# Patient Record
Sex: Female | Born: 1950 | Race: Black or African American | Hispanic: No | Smoking: Never smoker
Health system: Southern US, Community
[De-identification: ages and names within clinical notes are randomized; demographics above are authoritative.]

## PROBLEM LIST (undated history)

## (undated) DIAGNOSIS — F32A Depression, unspecified: Secondary | ICD-10-CM

## (undated) DIAGNOSIS — M1712 Unilateral primary osteoarthritis, left knee: Secondary | ICD-10-CM

## (undated) DIAGNOSIS — I10 Essential (primary) hypertension: Secondary | ICD-10-CM

## (undated) DIAGNOSIS — M48061 Spinal stenosis, lumbar region without neurogenic claudication: Secondary | ICD-10-CM

## (undated) HISTORY — DX: Unilateral primary osteoarthritis, left knee: M17.12

## (undated) HISTORY — PX: BREAST REDUCTION SURGERY: SHX8

## (undated) HISTORY — PX: OTHER SURGICAL HISTORY: SHX169

## (undated) HISTORY — PX: SPINE SURGERY: SHX786

## (undated) HISTORY — DX: Spinal stenosis, lumbar region without neurogenic claudication: M48.061

## (undated) HISTORY — PX: KNEE ARTHROSCOPY: SUR90

---

## 2008-03-27 DIAGNOSIS — G4730 Sleep apnea, unspecified: Secondary | ICD-10-CM | POA: Insufficient documentation

## 2010-09-25 DIAGNOSIS — N3941 Urge incontinence: Secondary | ICD-10-CM | POA: Insufficient documentation

## 2010-09-25 DIAGNOSIS — K219 Gastro-esophageal reflux disease without esophagitis: Secondary | ICD-10-CM | POA: Insufficient documentation

## 2010-09-25 DIAGNOSIS — I10 Essential (primary) hypertension: Secondary | ICD-10-CM | POA: Insufficient documentation

## 2010-09-25 DIAGNOSIS — E782 Mixed hyperlipidemia: Secondary | ICD-10-CM | POA: Insufficient documentation

## 2010-09-25 DIAGNOSIS — E559 Vitamin D deficiency, unspecified: Secondary | ICD-10-CM | POA: Insufficient documentation

## 2013-03-27 DIAGNOSIS — S42143A Displaced fracture of glenoid cavity of scapula, unspecified shoulder, initial encounter for closed fracture: Secondary | ICD-10-CM | POA: Insufficient documentation

## 2013-03-27 DIAGNOSIS — S42153A Displaced fracture of neck of scapula, unspecified shoulder, initial encounter for closed fracture: Secondary | ICD-10-CM | POA: Insufficient documentation

## 2013-03-27 DIAGNOSIS — M19019 Primary osteoarthritis, unspecified shoulder: Secondary | ICD-10-CM | POA: Insufficient documentation

## 2013-03-27 DIAGNOSIS — M7580 Other shoulder lesions, unspecified shoulder: Secondary | ICD-10-CM | POA: Insufficient documentation

## 2015-07-15 DIAGNOSIS — M5136 Other intervertebral disc degeneration, lumbar region: Secondary | ICD-10-CM | POA: Insufficient documentation

## 2015-07-15 DIAGNOSIS — M545 Low back pain, unspecified: Secondary | ICD-10-CM | POA: Insufficient documentation

## 2015-09-02 DIAGNOSIS — Z86010 Personal history of colonic polyps: Secondary | ICD-10-CM | POA: Insufficient documentation

## 2015-10-15 DIAGNOSIS — N393 Stress incontinence (female) (male): Secondary | ICD-10-CM | POA: Insufficient documentation

## 2015-12-23 ENCOUNTER — Encounter (INDEPENDENT_AMBULATORY_CARE_PROVIDER_SITE_OTHER): Payer: Medicare HMO | Admitting: Sports Medicine

## 2015-12-24 ENCOUNTER — Ambulatory Visit (INDEPENDENT_AMBULATORY_CARE_PROVIDER_SITE_OTHER): Payer: Medicare HMO

## 2015-12-24 ENCOUNTER — Ambulatory Visit (INDEPENDENT_AMBULATORY_CARE_PROVIDER_SITE_OTHER): Payer: Medicare HMO | Admitting: Sports Medicine

## 2015-12-24 DIAGNOSIS — M170 Bilateral primary osteoarthritis of knee: Secondary | ICD-10-CM | POA: Diagnosis not present

## 2015-12-24 DIAGNOSIS — M5136 Other intervertebral disc degeneration, lumbar region: Secondary | ICD-10-CM | POA: Diagnosis not present

## 2015-12-24 DIAGNOSIS — M545 Low back pain, unspecified: Secondary | ICD-10-CM

## 2015-12-24 DIAGNOSIS — M1712 Unilateral primary osteoarthritis, left knee: Secondary | ICD-10-CM

## 2015-12-24 DIAGNOSIS — M48061 Spinal stenosis, lumbar region without neurogenic claudication: Secondary | ICD-10-CM

## 2015-12-24 DIAGNOSIS — M419 Scoliosis, unspecified: Secondary | ICD-10-CM

## 2015-12-24 HISTORY — DX: Unilateral primary osteoarthritis, left knee: M17.12

## 2015-12-24 HISTORY — DX: Spinal stenosis, lumbar region without neurogenic claudication: M48.061

## 2015-12-24 MED ORDER — NAPROXEN 500 MG PO TABS: 500.0000 mg | ORAL_TABLET | Freq: Two times a day (BID) | ORAL | 3 refills | Status: DC

## 2015-12-24 NOTE — Assessment & Plan Note (Addendum)
Has had injections and viscous supplementation years ago. Repeat injection today, x-rays, naproxen. Physical therapy.  Return to see me in one month, we will get her approved again for Visco supplementation if no better.

## 2015-12-24 NOTE — Progress Notes (Signed)
   Subjective:    I'm seeing this patient as a consultation for:  Dr. Faythe GheeWarnimont  CC: Left knee pain  HPI:. Left knee pain: This is a pleasant 65 year old female with a history of osteoarthritis, history of viscous supplementation and steroid injections in the past, as well as knee arthroscopy. Now having recurrence of pain, swelling, localized at the medial joint line without any trauma, no constitutional symptoms, no mechanical symptoms.  Back pain: Axial, worse with laying in bed, standing up straight. Nothing overtly radicular pain does radiate down to the left anterolateral thigh. No bowel or bladder dysfunction, saddle numbness, constitutional symptoms.  Past medical history:  Negative.  See flowsheet/record as well for more information.  Surgical history: Negative.  See flowsheet/record as well for more information.  Family history: Negative.  See flowsheet/record as well for more information.  Social history: Negative.  See flowsheet/record as well for more information.  Allergies, and medications have been entered into the medical record, reviewed, and no changes needed.   Review of Systems: No headache, visual changes, nausea, vomiting, diarrhea, constipation, dizziness, abdominal pain, skin rash, fevers, chills, night sweats, weight loss, swollen lymph nodes, body aches, joint swelling, muscle aches, chest pain, shortness of breath, mood changes, visual or auditory hallucinations.   Objective:   General: Well Developed, well nourished, and in no acute distress.  Neuro/Psych: Alert and oriented x3, extra-ocular muscles intact, able to move all 4 extremities, sensation grossly intact. Skin: Warm and dry, no rashes noted.  Respiratory: Not using accessory muscles, speaking in full sentences, trachea midline.  Cardiovascular: Pulses palpable, no extremity edema. Abdomen: Does not appear distended. Left Knee: Normal to inspection with no erythema or effusion or obvious bony  abnormalities. Swollen with tenderness at the medial joint line ROM normal in flexion and extension and lower leg rotation. Ligaments with solid consistent endpoints including ACL, PCL, LCL, MCL. Negative Mcmurray's and provocative meniscal tests. Non painful patellar compression. Patellar and quadriceps tendons unremarkable. Hamstring and quadriceps strength is normal.  Procedure: Real-time Ultrasound Guided Injection of left knee Device: GE Logiq E  Verbal informed consent obtained.  Time-out conducted.  Noted no overlying erythema, induration, or other signs of local infection.  Skin prepped in a sterile fashion.  Local anesthesia: Topical Ethyl chloride.  With sterile technique and under real time ultrasound guidance:  1 mL kenalog 40, 2 mL lidocaine, 2 mL Marcaine injected easily. Completed without difficulty  Pain immediately resolved suggesting accurate placement of the medication.  Advised to call if fevers/chills, erythema, induration, drainage, or persistent bleeding.  Images permanently stored and available for review in the ultrasound unit.  Impression: Technically successful ultrasound guided injection.  Impression and Recommendations:   This case required medical decision making of moderate complexity.  Primary osteoarthritis of left knee Has had injections and viscous supplementation years ago. Repeat injection today, x-rays, naproxen. Physical therapy.  Return to see me in one month, we will get her approved again for Visco supplementation if no better.  Low back pain Axial and facetogenic back pain. She did have what sounds to be either an epidural or facet injection with [facility], and agrees to get me records of her injection.  Formal physical therapy, x-rays. MRI if no better after 4-6 weeks.

## 2015-12-24 NOTE — Assessment & Plan Note (Signed)
Axial and facetogenic back pain. She did have what sounds to be either an epidural or facet injection with [facility], and agrees to get me records of her injection.  Formal physical therapy, x-rays. MRI if no better after 4-6 weeks.

## 2016-01-06 ENCOUNTER — Ambulatory Visit: Payer: Medicare HMO | Admitting: Physical Therapy

## 2016-01-10 ENCOUNTER — Ambulatory Visit: Payer: Medicare HMO | Admitting: Rehabilitative and Restorative Service Providers"

## 2016-01-20 ENCOUNTER — Ambulatory Visit (INDEPENDENT_AMBULATORY_CARE_PROVIDER_SITE_OTHER): Payer: Medicare HMO | Admitting: Sports Medicine

## 2016-01-20 ENCOUNTER — Telehealth: Payer: Self-pay | Admitting: Sports Medicine

## 2016-01-20 DIAGNOSIS — M545 Low back pain, unspecified: Secondary | ICD-10-CM

## 2016-01-20 DIAGNOSIS — M1712 Unilateral primary osteoarthritis, left knee: Secondary | ICD-10-CM | POA: Diagnosis not present

## 2016-01-20 MED ORDER — GABAPENTIN 300 MG PO CAPS: ORAL_CAPSULE | ORAL | 3 refills | Status: DC

## 2016-01-20 NOTE — Progress Notes (Signed)
  Subjective:    CC: Follow-up  HPI: Left knee osteoarthritis: Improved significantly after injection, did have a slight recurrence of pain, agreeable to proceed with Visco.  Low back pain: Worse with extension, walks in a forward flexed posture consistent with lumbar spinal stenosis. She has not yet started her physical therapy. Only taking naproxen, no neuropathic agents.  Past medical history:  Negative.  See flowsheet/record as well for more information.  Surgical history: Negative.  See flowsheet/record as well for more information.  Family history: Negative.  See flowsheet/record as well for more information.  Social history: Negative.  See flowsheet/record as well for more information.  Allergies, and medications have been entered into the medical record, reviewed, and no changes needed.   Review of Systems: No fevers, chills, night sweats, weight loss, chest pain, or shortness of breath.   Objective:    General: Well Developed, well nourished, and in no acute distress.  Neuro: Alert and oriented x3, extra-ocular muscles intact, sensation grossly intact.  HEENT: Normocephalic, atraumatic, pupils equal round reactive to light, neck supple, no masses, no lymphadenopathy, thyroid nonpalpable.  Skin: Warm and dry, no rashes. Cardiac: Regular rate and rhythm, no murmurs rubs or gallops, no lower extremity edema.  Respiratory: Clear to auscultation bilaterally. Not using accessory muscles, speaking in full sentences.  Impression and Recommendations:    Low back pain Multilevel degenerative changes on x-rays. Had some injections with [facility] but all she brought was her CD with x-ray images. Symptoms do sound to be related to either facet mediated pain or lumbar spinal stenosis. She is doing to work hard in physical therapy, continue naproxen 500. I'm adding gabapentin. Return to see me in 6 weeks, we will get a new MRI if insufficient relief in pain.  Primary osteoarthritis  of left knee Good relief with steroid injection, having some recurrence of pain, getting her set up for viscosupplementation.  I spent 25 minutes with this patient, greater than 50% was face-to-face time counseling regarding the above diagnoses

## 2016-01-20 NOTE — Telephone Encounter (Signed)
-----   Message from Monica Bectonhomas J Thekkekandam, MD sent at 01/20/2016 11:18 AM EST ----- Orthov[facility] left knee approval please ___________________________________________ Ihor Austinhomas J. Benjamin Stainhekkekandam, M.D., ABFM., CAQSM. Primary Care and Sports Medicine [facility]  Adjunct Instructor of Family Medicine  [employer]

## 2016-01-20 NOTE — Telephone Encounter (Signed)
Submitted for approval on Orth[facility]. Awaiting confirmation.  

## 2016-01-20 NOTE — Assessment & Plan Note (Signed)
Multilevel degenerative changes on x-rays. Had some injections with [facility] but all she brought was her CD with x-ray images. Symptoms do sound to be related to either facet mediated pain or lumbar spinal stenosis. She is doing to work hard in physical therapy, continue naproxen 500. I'm adding gabapentin. Return to see me in 6 weeks, we will get a new MRI if insufficient relief in pain.

## 2016-01-20 NOTE — Assessment & Plan Note (Signed)
Good relief with steroid injection, having some recurrence of pain, getting her set up for viscosupplementation.

## 2016-01-21 NOTE — Telephone Encounter (Signed)
Received the following information from OV benefits investigation:  Patient has Medicare Local Medicare Advantage PPO plan with an effective date of 10/11/2015. A pre-certification is needed, call [phone] to obtain. Plan renews on 02/10/2016. Benefits for dates of service after 02/10/2016 may vary. We recommend you resubmit for dates of service after 02/10/2016. [id number] is covered at 80% & [id number] is covered at 100% of the contracted rate when performed in an office setting. *Deductible does not apply. If out of pocket is met, coverage goes to 100% and copay will no longer apply. A copay of $45.00 applies whether or not a specialist office visit is billed. Ref# [id number]   Left VM for Pt advising we will resubmit in January 2018.

## 2016-02-06 ENCOUNTER — Ambulatory Visit (INDEPENDENT_AMBULATORY_CARE_PROVIDER_SITE_OTHER): Payer: Medicare HMO | Admitting: Physical Therapy

## 2016-02-06 DIAGNOSIS — M545 Low back pain, unspecified: Secondary | ICD-10-CM

## 2016-02-06 DIAGNOSIS — M6281 Muscle weakness (generalized): Secondary | ICD-10-CM

## 2016-02-06 DIAGNOSIS — R293 Abnormal posture: Secondary | ICD-10-CM

## 2016-02-06 DIAGNOSIS — M25562 Pain in left knee: Secondary | ICD-10-CM

## 2016-02-06 DIAGNOSIS — G8929 Other chronic pain: Secondary | ICD-10-CM

## 2016-02-06 DIAGNOSIS — M62830 Muscle spasm of back: Secondary | ICD-10-CM

## 2016-02-06 NOTE — Patient Instructions (Addendum)
On Elbows (Prone)    [place] [phone]    Rise up on elbows as high as possible, keeping hips on floor. Hold __30-45__ seconds. Repeat __1__ times per set. Do __1__ sets per session. Do _1___ sessions per day.   Press-Up    Press upper body upward, keeping hips in contact with floor. Keep lower back and buttocks relaxed. Hold __1-2__ seconds. Repeat _5___ times per set. Do __1__ sets per session. Do ___1_ sessions per day.  Quads / HF, Prone use a towel or sheet to help    Lie face down, knees together. Grasp one ankle with same-side hand. Use towel if needed to reach. Gently pull foot toward buttock. Hold _30-45__ seconds. Repeat _1__ times per session. Do _1__ sessions per day. Repeat on the other side.   Supine Knee-to-Chest, Unilateral    Lie on back, hands clasped behind one knee. Pull knee in toward chest until a comfortable stretch is felt in lower back and buttocks. Hold _30-45__ seconds.  Repeat _1__ times per session. Do _1__ sessions per day.   Lower Trunk Rotation Stretch    Keeping back flat and feet together, rotate knees to left side. Hold __1-2__ seconds. Then rotate knees to the right side.  Repeat __10__ times per set. Do __1__ sets per session. Do _1___ sessions per day. Copyright  VHI. All rights reserved.   Trigger Point Dry Needling  . What is Trigger Point Dry Needling (DN)? o DN is a physical therapy technique used to treat muscle pain and dysfunction. Specifically, DN helps deactivate muscle trigger points (muscle knots).  o A thin filiform needle is used to penetrate the skin and stimulate the underlying trigger point. The goal is for a local twitch response (LTR) to occur and for the trigger point to relax. No medication of any kind is injected during the procedure.   . What Does Trigger Point Dry Needling Feel Like?  o The procedure feels different for each individual patient. Some patients report that they do not actually feel the needle  enter the skin and overall the process is not painful. Very mild bleeding may occur. However, many patients feel a deep cramping in the muscle in which the needle was inserted. This is the local twitch response.   Marland Kitchen. How Will I feel after the treatment? o Soreness is normal, and the onset of soreness may not occur for a few hours. Typically this soreness does not last longer than two days.  o Bruising is uncommon, however; ice can be used to decrease any possible bruising.  o In rare cases feeling tired or nauseous after the treatment is normal. In addition, your symptoms may get worse before they get better, this period will typically not last longer than 24 hours.   . What Can I do After My Treatment? o Increase your hydration by drinking more water for the next 24 hours. o You may place ice or heat on the areas treated that have become sore, however, do not use heat on inflamed or bruised areas. Heat often brings more relief post needling. o You can continue your regular activities, but vigorous activity is not recommended initially after the treatment for 24 hours. o DN is best combined with other physical therapy such as strengthening, stretching, and other therapies.

## 2016-02-06 NOTE — Therapy (Addendum)
[facility] [place] [place], [place], [address] Phone: [phone]   Fax:  [phone]  Physical Therapy Evaluation  Patient Details  Name: Karina Brown MRN: [id number] Date of Birth: 1950-03-20 Referring Provider: Dr Dianah Field  Encounter Date: 02/06/2016      PT End of Session - 02/06/16 0847    Visit Number 1   Number of Visits 8   Date for PT Re-Evaluation 04/02/16   PT Start Time 9381   PT Stop Time 0946   PT Time Calculation (min) 59 min   Activity Tolerance Patient limited by pain      No past medical history on file.  No past surgical history on file.  There were no vitals filed for this visit.       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 She stopped that and the pain settled down, she is cashiering again and the pain has returned. She is now tired of the pain and figured its time to see a doctor because she is tired of being in pain.    Pertinent History osteoporosis, Lt knee scope 10-12 yrs ago.    How long can you sit comfortably? no limitations in sitting, - has pain with transitioning to stand.    How long can you stand comfortably? in flexed posture < 5'   How long can you walk comfortably? pain with walking around her house.    Diagnostic tests x-rays - degeneration   Patient Stated Goals minimize or relieve Lt knee and back pain so she can walk without pain or feelings like she needs to bend forward and hold onto something.    Currently in Pain? Yes   Pain Score 7    Pain Location Back   Pain Orientation Left;Right   Pain Descriptors / Indicators Aching   Pain Type Chronic pain   Pain Radiating Towards into Lt LE to knee   Pain Onset More than a month ago   Pain Frequency Constant   Aggravating Factors  walking   Pain Relieving Factors sitting down   Effect of Pain on Daily  Activities limits ability to move around and work.    Multiple Pain Sites Yes   Pain Score 7   Pain Location Knee   Pain Orientation Left   Pain Descriptors / Indicators Aching;Stabbing   Pain Type Chronic pain   Pain Onset More than a month ago   Pain Frequency Constant   Aggravating Factors  transitioning to stand   Pain Relieving Factors walking             [facility] PT Assessment - 02/06/16 0001      Assessment   Medical Diagnosis bilat LBP, osteoporosis   Referring Provider Dr Dianah Field   Onset Date/Surgical Date 02/06/15   Hand Dominance Right   Next MD Visit 03/02/16   Prior Therapy none     Precautions   Precautions None     Balance Screen   Has the patient fallen in the past 6 months No     Prior Function   Level of Independence Independent   Vocation Part time employment   Vocation Requirements as Scientist, water quality   Leisure watch tv     Observation/Other Assessments   Focus on Therapeutic Outcomes (FOTO)  68% limited     Posture/Postural Control   Posture/Postural Control Postural limitations  Postural Limitations Flexed trunk;Weight shift right;Forward head;Rounded Shoulders     ROM / Strength   AROM / PROM / Strength AROM;Strength     AROM   Overall AROM Comments --  bilat hips ~ 10 degrees from neutral    AROM Assessment Site Lumbar;Knee   Right/Left Knee --  extension Rt -3, Lt -7   Lumbar Flexion NA due to osteoporosis   Lumbar Extension to neutral with easing of pain   Lumbar - Right Rotation decreased 75%   Lumbar - Left Rotation decreased 75%      Strength   Strength Assessment Site Hip;Knee;Ankle;Lumbar   Right/Left Hip Right;Left   Right Hip Flexion 5/5  pain in the back   Right Hip Extension 4/5   Right Hip ABduction 3+/5   Left Hip Flexion 5/5  pain in the back   Left Hip Extension 3-/5   Left Hip ABduction 4-/5   Right/Left Knee --  Rt WNl, Lt grossly 4+/5, some pain with ext   Right/Left Ankle --  bilat WNL   Lumbar Flexion --   TA poor   Lumbar Extension --  multifidi poor     Flexibility   Soft Tissue Assessment /Muscle Length yes  tight and pain with SKTC bilat    Hamstrings supine SLR Lt 88, Rt    Quadriceps prone knee flex Lt 80, Rt 94     Palpation   Palpation comment very tight in bilat buttocks, low back QL      Transfers   Comments requires bilat UE assist to stand due to pain                   [facility] Adult PT Treatment/Exercise - 02/06/16 0001      Exercises   Exercises Lumbar     Lumbar Exercises: Stretches   Single Knee to Chest Stretch 30 seconds   Lower Trunk Rotation 5 reps   Prone on Elbows Stretch 30 seconds   Press Ups 5 reps   Quad Stretch 30 seconds  with strap     Modalities   Modalities Electrical Stimulation;Moist Heat     Moist Heat Therapy   Number Minutes Moist Heat 20 Minutes   Moist Heat Location Lumbar Spine     Electrical Stimulation   Electrical Stimulation Location lumbar   Electrical Stimulation Action IFC   Electrical Stimulation Parameters to tolerance   Electrical Stimulation Goals Pain;Tone                     PT Long Term Goals - 02/06/16 7353      PT LONG TERM GOAL #1   Title I with HEP for flexibility and core strengthening ( 04/02/16)    Time 8   Period Weeks   Status New     PT LONG TERM GOAL #2   Title report reduction of overall pain =/> 50% with walking ( 04/02/16)    Time 8   Period Weeks   Status New     PT LONG TERM GOAL #3   Title increase bilat hip strength =/> 5-/5 ( 04/02/16)    Time 8   Period Weeks   Status New     PT LONG TERM GOAL #4   Title demo trunk extension 50% and bilat hip ext 10 degrees ( 04/02/16)    Time 8   Period Weeks   Status New     PT LONG TERM GOAL #5   Title improve FOTO =/<  54% limited, CK level ( 04/02/16)    Time 8   Period Weeks   Status New               Plan - 02/06/16 0931    Clinical Impression Statement 65 yo female with long h/o LBP and knee pain.  She  has osteoporosis and a Lt TKA has been advised. She doesn't want to have surgery .  She has postural changes due to pain, leans forward, core and hip weakness, hip stiffness and multiple tight muscles.      Rehab Potential Fair   PT Frequency --  patient can only attend every 2-3 wks due to the cost of therapy.    PT Duration 8 weeks   PT Treatment/Interventions Moist Heat;Ultrasound;Therapeutic exercise;Dry needling;Taping;Manual techniques;Neuromuscular re-education;Cryotherapy;Electrical Stimulation;Iontophoresis 6m/ml Dexamethasone;Patient/family education   PT Next Visit Plan TDN to gluts and low back, add in TA stabilization ex.    Consulted and Agree with Plan of Care Patient      Patient will benefit from skilled therapeutic intervention in order to improve the following deficits and impairments:  Postural dysfunction, Decreased strength, Decreased mobility, Pain, Increased muscle spasms, Decreased range of motion  Visit Diagnosis: Chronic bilateral low back pain without sciatica - Plan: PT plan of care cert/re-cert  Muscle weakness (generalized) - Plan: PT plan of care cert/re-cert  Chronic pain of left knee - Plan: PT plan of care cert/re-cert  Abnormal posture - Plan: PT plan of care cert/re-cert  Muscle spasm of back - Plan: PT plan of care cert/re-cert     Problem List Patient Active Problem List   Diagnosis Date Noted  . Primary osteoarthritis of left knee 12/24/2015  . Low back pain 12/24/2015    SBoneta LucksrPT  02/06/2016, 9:41 AM  [facility] [address] [address]Phone: [phone]  Fax:  [phone] Name: Karina DembeckMRN: [id number] of Birth: 8March 12, 1952 PHYSICAL THERAPY DISCHARGE SUMMARY  Visits from Start of Care: 1 Current functional level related to goals / functional outcomes: unknown   Remaining deficits: unknown   Education / Equipment: Initial  HEP Plan:                                                    Patient goals were not met. Patient is being discharged due to not returning since the last visit.  ?????     SJeral Pinch PT 03/31/16 8:45 AM

## 2016-02-17 ENCOUNTER — Encounter: Payer: Self-pay | Admitting: Rehabilitative and Restorative Service Providers"

## 2016-02-18 NOTE — Telephone Encounter (Signed)
Submitted for approval on Orth[facility]. Awaiting confirmation.  

## 2016-02-20 NOTE — Telephone Encounter (Signed)
Received the following information from OV benefits investigation:  Patient has a Medicare [place] with an effective date of 02/10/2016. A Pre-Certification is needed, [phone] call to obtain. [id number] is covered at 80% & [id number] is covered at 100% of the contracted rate when performed in an office setting. $50.00 copay for specialist office visit applies. If out of pocket is met, coverage goes to 100% & copay will no longer apply. Reference: [id number]   Calculated Pt's estimated OOP. Contacted Pt regarding cost, Pt states she will have to "think about that much money." Advised Pt if she would like to get the injections I will need to complete a prior authorization so it will take a little time once I get the OK from her. Verbalized understanding, she will contact office if she would like to proceed once she looks at her finances. No further questions.

## 2016-02-25 NOTE — Telephone Encounter (Signed)
Received the following information from OV benefits investigation:   Patient has a Medicare [place] with an effective date of 02/10/2016. A Pre-Certification is needed, [phone] call to obtain. [id number] is covered at 80% & [id number] is covered at 100% of the contracted rate when performed in an office setting. $50.00 copay for specialist office visit applies. If out of pocket is met, coverage goes to 100% & copay will no longer apply. Reference: [id number]  Martin Majestic over the estimated OOP with the Patient. She is going to look over her finances and will see if she can afford injections. Patient will contact clinic to let us know. Will need to get PA if Pt decides to start series.

## 2016-03-02 ENCOUNTER — Other Ambulatory Visit: Payer: Self-pay | Admitting: Sports Medicine

## 2016-03-02 ENCOUNTER — Ambulatory Visit (INDEPENDENT_AMBULATORY_CARE_PROVIDER_SITE_OTHER): Payer: Medicare HMO | Admitting: Sports Medicine

## 2016-03-02 ENCOUNTER — Other Ambulatory Visit: Payer: Medicare HMO

## 2016-03-02 DIAGNOSIS — F329 Major depressive disorder, single episode, unspecified: Secondary | ICD-10-CM | POA: Diagnosis not present

## 2016-03-02 DIAGNOSIS — M1712 Unilateral primary osteoarthritis, left knee: Secondary | ICD-10-CM

## 2016-03-02 DIAGNOSIS — M545 Low back pain, unspecified: Secondary | ICD-10-CM

## 2016-03-02 MED ORDER — COSAMIN ASU ADVANCED FORMULA PO CAPS: 2.0000 | ORAL_CAPSULE | Freq: Two times a day (BID) | ORAL | 11 refills | Status: DC

## 2016-03-02 MED ORDER — ACETAMINOPHEN ER 650 MG PO TBCR: 650.0000 mg | EXTENDED_RELEASE_TABLET | Freq: Three times a day (TID) | ORAL | 3 refills | Status: DC | PRN

## 2016-03-02 MED ORDER — BUPROPION HCL ER (XL) 300 MG PO TB24: 300.0000 mg | ORAL_TABLET | Freq: Every day | ORAL | 1 refills | Status: DC

## 2016-03-02 NOTE — Assessment & Plan Note (Signed)
Had a moderate but short-lived relief with a steroid injection, unable to afford Orthovisc right now, she will continue Naproxen 500 twice a day, adding arthritis strength Tylenol, and Glucosamine and Chondroitin. She will call us when she is ready to proceed with Orthovisc.

## 2016-03-02 NOTE — Assessment & Plan Note (Signed)
Multilevel degenerative changes on x-rays, has been through greater than 6 weeks of physician directed rehabilitation including physical therapy. Continue naproxen 500, increasing gabapentin to 600 mg 3 times per day, I am going to go ahead and get an MRI. Return to go over MRI results.

## 2016-03-02 NOTE — Progress Notes (Signed)
  Subjective:    CC: Follow-up  HPI: Low back pain: Persistent despite current medications. No bowel or bladder dysfunction, pain is mostly axial.  Knee pain: Responded well to steroid injections, Orthovisc would be approved but she cannot afford it right now.  Depression and anxiety: Has had several stressors around the house, agreeable to increase her Wellbutrin, she is already on 225 mg of Effexor. No suicidal or homicidal ideation  Past medical history:  Negative.  See flowsheet/record as well for more information.  Surgical history: Negative.  See flowsheet/record as well for more information.  Family history: Negative.  See flowsheet/record as well for more information.  Social history: Negative.  See flowsheet/record as well for more information.  Allergies, and medications have been entered into the medical record, reviewed, and no changes needed.   Review of Systems: No fevers, chills, night sweats, weight loss, chest pain, or shortness of breath.   Objective:    General: Well Developed, well nourished, and in no acute distress.  Neuro: Alert and oriented x3, extra-ocular muscles intact, sensation grossly intact.  HEENT: Normocephalic, atraumatic, pupils equal round reactive to light, neck supple, no masses, no lymphadenopathy, thyroid nonpalpable.  Skin: Warm and dry, no rashes. Cardiac: Regular rate and rhythm, no murmurs rubs or gallops, no lower extremity edema.  Respiratory: Clear to auscultation bilaterally. Not using accessory muscles, speaking in full sentences.  Impression and Recommendations:    Low back pain Multilevel degenerative changes on x-rays, has been through greater than 6 weeks of physician directed rehabilitation including physical therapy. Continue naproxen 500, increasing gabapentin to 600 mg 3 times per day, I am going to go ahead and get an MRI. Return to go over MRI results.  Primary osteoarthritis of left knee Had a moderate but short-lived  relief with a steroid injection, unable to afford Orthovisc right now, she will continue Naproxen 500 twice a day, adding arthritis strength Tylenol, and Glucosamine and Chondroitin. She will call us when she is ready to proceed with Orthovisc.  Major depression With multiple stressors currently going on in her life. Uncontrolled depression we'll certainly make it difficult to control her pain. Continue follow-up with her primary care provider, she is currently doing Effexor 225 mg, Wellbutrin 150, I'm going to increase this to 300 mg. I think she does have follow-up arranged with psychiatry, there is always the option of switching from Effexor to Cymbalta and/or adding Abilify.  I spent 25 minutes with this patient, greater than 50% was face-to-face time counseling regarding the above diagnoses

## 2016-03-02 NOTE — Assessment & Plan Note (Signed)
With multiple stressors currently going on in her life. Uncontrolled depression we'll certainly make it difficult to control her pain. Continue follow-up with her primary care provider, she is currently doing Effexor 225 mg, Wellbutrin 150, I'm going to increase this to 300 mg. I think she does have follow-up arranged with psychiatry, there is always the option of switching from Effexor to Cymbalta and/or adding Abilify.

## 2016-03-09 ENCOUNTER — Ambulatory Visit (INDEPENDENT_AMBULATORY_CARE_PROVIDER_SITE_OTHER): Payer: Medicare HMO

## 2016-03-09 DIAGNOSIS — M4327 Fusion of spine, lumbosacral region: Secondary | ICD-10-CM | POA: Diagnosis not present

## 2016-03-09 DIAGNOSIS — M4326 Fusion of spine, lumbar region: Secondary | ICD-10-CM | POA: Diagnosis not present

## 2016-03-09 DIAGNOSIS — M545 Low back pain, unspecified: Secondary | ICD-10-CM

## 2016-03-09 DIAGNOSIS — M7138 Other bursal cyst, other site: Secondary | ICD-10-CM

## 2016-03-09 DIAGNOSIS — M48061 Spinal stenosis, lumbar region without neurogenic claudication: Secondary | ICD-10-CM

## 2016-03-12 ENCOUNTER — Encounter: Payer: Self-pay | Admitting: Sports Medicine

## 2016-03-12 ENCOUNTER — Ambulatory Visit (INDEPENDENT_AMBULATORY_CARE_PROVIDER_SITE_OTHER): Payer: Medicare HMO | Admitting: Sports Medicine

## 2016-03-12 DIAGNOSIS — M48062 Spinal stenosis, lumbar region with neurogenic claudication: Secondary | ICD-10-CM

## 2016-03-12 NOTE — Assessment & Plan Note (Signed)
This is quite severe at the L2-L3 level multifactorial related to facet hypertrophy, ligamentum flavum hypertrophy, there is a facet joint cyst. She does have some left-sided neurogenic claudication. No bowel or bladder dysfunction. I'm going to set her up with a left L2-L3 interlaminar epidural however considering how tight her spinal stenosis is I do think she will ultimately need a decompressive procedure. We did increase her Wellbutrin and her gabapentin the last visit without much improvement. Return to see me one month after epidural.

## 2016-03-12 NOTE — Progress Notes (Signed)
  Subjective:    CC: Follow-up  HPI: This is a pleasant 66 year old female with chronic low back pain, she has failed greater than with conservative measures, and currently on a medication regimen including gabapentin, and Effexor. She has failed multiple months of physical therapy. We obtain an MRI the results of which will be dictated below. She does, she gets cramping in her left leg with walking, and pain is worse when standing up straight and better with leaning forward.  Past medical history:  Negative.  See flowsheet/record as well for more information.  Surgical history: Negative.  See flowsheet/record as well for more information.  Family history: Negative.  See flowsheet/record as well for more information.  Social history: Negative.  See flowsheet/record as well for more information.  Allergies, and medications have been entered into the medical record, reviewed, and no changes needed.   Review of Systems: No fevers, chills, night sweats, weight loss, chest pain, or shortness of breath.   Objective:    General: Well Developed, well nourished, and in no acute distress.  Neuro: Alert and oriented x3, extra-ocular muscles intact, sensation grossly intact.  HEENT: Normocephalic, atraumatic, pupils equal round reactive to light, neck supple, no masses, no lymphadenopathy, thyroid nonpalpable.  Skin: Warm and dry, no rashes. Cardiac: Regular rate and rhythm, no murmurs rubs or gallops, no lower extremity edema.  Respiratory: Clear to auscultation bilaterally. Not using accessory muscles, speaking in full sentences.  MRI shows severe lumbar spinal stenosis at the L2-L3 level that is multifactorial related to facet joint hypertrophy, ligamentum flavum hypertrophy and disc protrusion.  Impression and Recommendations:    Lumbar spinal stenosis This is quite severe at the L2-L3 level multifactorial related to facet hypertrophy, ligamentum flavum hypertrophy, there is a facet joint  cyst. She does have some left-sided neurogenic claudication. No bowel or bladder dysfunction. I'm going to set her up with a left L2-L3 interlaminar epidural however considering how tight her spinal stenosis is I do think she will ultimately need a decompressive procedure. We did increase her Wellbutrin and her gabapentin the last visit without much improvement. Return to see me one month after epidural.  I spent 25 minutes with this patient, greater than 50% was face-to-face time counseling regarding the above diagnoses

## 2016-04-07 ENCOUNTER — Encounter: Payer: Self-pay | Admitting: Sports Medicine

## 2016-04-17 ENCOUNTER — Ambulatory Visit
Admission: RE | Admit: 2016-04-17 | Discharge: 2016-04-17 | Disposition: A | Discharge status: Home / Self Care | Payer: Medicare HMO | Source: Ambulatory Visit | Attending: Sports Medicine | Admitting: Sports Medicine

## 2016-04-17 IMAGING — XA Imaging study
1 series · 1 of 1 positions shown · non-contrast
Comparison: none

CLINICAL DATA: Left low back pain. MR demonstrates severe spinal
stenosis L2-3.

[Series 1: ortho standard · 1 of 1 slices shown]
[im 1/1]
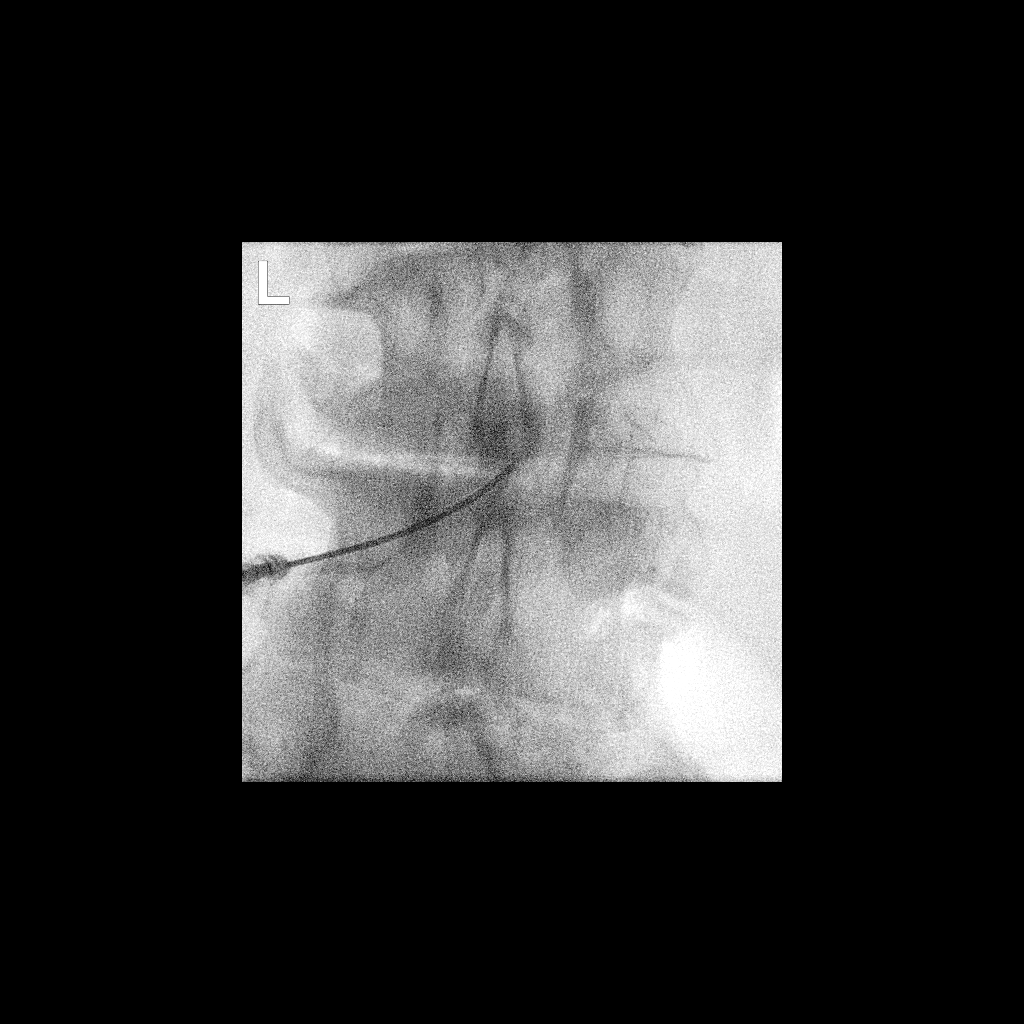

[1 of 1 positions shown; findings below may reference images not displayed]

EXAM:
LUMBAR EPIDURAL INJECTION UNDER FLUOROSCOPY

PROCEDURE:
The procedure, risks, benefits, and alternatives were explained to
the patient. Questions regarding the procedure were encouraged and
answered. The patient understands and consents to the procedure.

Operator donned sterile gloves and mask. The overlying skin was
cleansed and anesthetized. An interlaminar approach was performed on
the left at L2-3. A 20 gauge Crawford epidural needle was advanced
using loss-of-resistance technique.

Injection of Omnipaque 180 shows a good epidural pattern with spread
above and below the level of needle placement. No intrathecal or
vascular opacification is seen.

120mg of Depo-Medrol mixed with 5ml lidocaine 1% were instilled. The
procedure was well-tolerated, and the patient was discharged thirty
minutes following the injection in good condition.

FLUOROSCOPY TIME:  72 seconds (106 [37] DAP)

COMPLICATIONS:
None
IMPRESSION: Technically successful epidural injection on the left at L2-3.

## 2016-04-17 MED ORDER — IOPAMIDOL (ISOVUE-M 200) INJECTION 41%
1.0000 mL | Freq: Once | INTRAMUSCULAR | Status: AC
  Administered 2016-04-17: 1 mL via EPIDURAL

## 2016-04-17 MED ORDER — METHYLPREDNISOLONE ACETATE 40 MG/ML INJ SUSP (RADIOLOG
120.0000 mg | Freq: Once | INTRAMUSCULAR | Status: AC
  Administered 2016-04-17: 120 mg via EPIDURAL

## 2016-04-17 NOTE — Discharge Instructions (Signed)

## 2016-04-23 ENCOUNTER — Encounter: Payer: Self-pay | Admitting: Sports Medicine

## 2016-04-23 ENCOUNTER — Ambulatory Visit (INDEPENDENT_AMBULATORY_CARE_PROVIDER_SITE_OTHER): Payer: Medicare HMO | Admitting: Sports Medicine

## 2016-04-23 DIAGNOSIS — M48062 Spinal stenosis, lumbar region with neurogenic claudication: Secondary | ICD-10-CM

## 2016-04-23 DIAGNOSIS — M1712 Unilateral primary osteoarthritis, left knee: Secondary | ICD-10-CM | POA: Diagnosis not present

## 2016-04-23 NOTE — Assessment & Plan Note (Signed)
Orthovisc was approved, patient agrees to proceed, steroid injection was 4 months ago, repeat steroid injection followed by Orthovisc. Return in one week for injection #2 of 4.

## 2016-04-23 NOTE — Assessment & Plan Note (Signed)
Repeat left L2-L3 interlaminar epidural, if axial pain continues hitting the facets at this level would also be a possible interventional target. She does have left-sided neurogenic claudication. Again, I do think that she will need decompressive procedure considering the severity of her central canal stenosis.

## 2016-04-23 NOTE — Progress Notes (Signed)
  Subjective:    CC: Follow-up  HPI: Right knee osteoarthritis: Did well several months ago after steroid injection, at this point is agreeable to do another steroid injection and started viscous supplementation, she is having recurrence of pain, at the medial and posterior joint lines, no mechanical symptoms, no trauma.  Lumbar spinal stenosis: Did very well after an L2-L3 interlaminar epidural, agrees to proceed with a repeat, still has some pain.  Past medical history:  Negative.  See flowsheet/record as well for more information.  Surgical history: Negative.  See flowsheet/record as well for more information.  Family history: Negative.  See flowsheet/record as well for more information.  Social history: Negative.  See flowsheet/record as well for more information.  Allergies, and medications have been entered into the medical record, reviewed, and no changes needed.   Review of Systems: No fevers, chills, night sweats, weight loss, chest pain, or shortness of breath.   Objective:    General: Well Developed, well nourished, and in no acute distress.  Neuro: Alert and oriented x3, extra-ocular muscles intact, sensation grossly intact.  HEENT: Normocephalic, atraumatic, pupils equal round reactive to light, neck supple, no masses, no lymphadenopathy, thyroid nonpalpable.  Skin: Warm and dry, no rashes. Cardiac: Regular rate and rhythm, no murmurs rubs or gallops, no lower extremity edema.  Respiratory: Clear to auscultation bilaterally. Not using accessory muscles, speaking in full sentences.  Procedure: Real-time Ultrasound Guided Injection of left knee Device: GE Logiq E  Verbal informed consent obtained.  Time-out conducted.  Noted no overlying erythema, induration, or other signs of local infection.  Skin prepped in a sterile fashion.  Local anesthesia: Topical Ethyl chloride.  With sterile technique and under real time ultrasound guidance:  Injected 1 mL kenalog 40, 2 mL  lidocaine, 2 mL bupivacaine, syringe switched and 30 mg/2 mL of OrthoVisc (sodium hyaluronate) in a prefilled syringe was injected easily into the knee through a 22-gauge needle. Completed without difficulty  Pain immediately resolved suggesting accurate placement of the medication.  Advised to call if fevers/chills, erythema, induration, drainage, or persistent bleeding.  Images permanently stored and available for review in the ultrasound unit.  Impression: Technically successful ultrasound guided injection.  Impression and Recommendations:    Lumbar spinal stenosis Repeat left L2-L3 interlaminar epidural, if axial pain continues hitting the facets at this level would also be a possible interventional target. She does have left-sided neurogenic claudication. Again, I do think that she will need decompressive procedure considering the severity of her central canal stenosis.  Primary osteoarthritis of left knee Orthovisc was approved, patient agrees to proceed, steroid injection was 4 months ago, repeat steroid injection followed by Orthovisc. Return in one week for injection #2 of 4.

## 2016-04-30 ENCOUNTER — Ambulatory Visit: Payer: Medicare HMO | Admitting: Sports Medicine

## 2016-05-01 ENCOUNTER — Ambulatory Visit (INDEPENDENT_AMBULATORY_CARE_PROVIDER_SITE_OTHER): Payer: Medicare HMO | Admitting: Sports Medicine

## 2016-05-01 DIAGNOSIS — M1712 Unilateral primary osteoarthritis, left knee: Secondary | ICD-10-CM

## 2016-05-01 NOTE — Progress Notes (Signed)
  Procedure: Real-time Ultrasound Guided aspiration/injection of left knee Device: GE Logiq E  Verbal informed consent obtained.  Time-out conducted.  Noted no overlying erythema, induration, or other signs of local infection.  Skin prepped in a sterile fashion.  Local anesthesia: Topical Ethyl chloride.  With sterile technique and under real time ultrasound guidance:  Aspirated 19 mL minimally cloudy straw-colored fluid, syringe switched and 30 mg/2 mL of OrthoVisc (sodium hyaluronate) in a prefilled syringe was injected easily into the knee through a 18-gauge needle. Completed without difficulty  Pain immediately resolved suggesting accurate placement of the medication.  Advised to call if fevers/chills, erythema, induration, drainage, or persistent bleeding.  Images permanently stored and available for review in the ultrasound unit.  Impression: Technically successful ultrasound guided injection.

## 2016-05-01 NOTE — Assessment & Plan Note (Signed)
Aspiration with Orthovisc injection #2 into the left knee, return in one week for #3.

## 2016-05-07 ENCOUNTER — Ambulatory Visit (INDEPENDENT_AMBULATORY_CARE_PROVIDER_SITE_OTHER): Payer: Medicare HMO | Admitting: Sports Medicine

## 2016-05-07 ENCOUNTER — Encounter: Payer: Self-pay | Admitting: Sports Medicine

## 2016-05-07 VITALS — BP 109/67 | HR 111 | Resp 18 | Wt 237.0 lb

## 2016-05-07 DIAGNOSIS — M1712 Unilateral primary osteoarthritis, left knee: Secondary | ICD-10-CM

## 2016-05-07 NOTE — Assessment & Plan Note (Signed)
Orthovisc injection #3, return in one week for #4. 

## 2016-05-07 NOTE — Progress Notes (Signed)
  Procedure: Real-time Ultrasound Guided aspiration/injection of left knee Device: GE Logiq E  Verbal informed consent obtained.  Time-out conducted.  Noted no overlying erythema, induration, or other signs of local infection.  Skin prepped in a sterile fashion.  Local anesthesia: Topical Ethyl chloride.  With sterile technique and under real time ultrasound guidance:  Aspirated 11 mL minimally cloudy straw-colored fluid, syringe switched and 30 mg/2 mL of OrthoVisc (sodium hyaluronate) in a prefilled syringe was injected easily into the knee through a 18-gauge needle. Completed without difficulty  Pain immediately resolved suggesting accurate placement of the medication.  Advised to call if fevers/chills, erythema, induration, drainage, or persistent bleeding.  Images permanently stored and available for review in the ultrasound unit.  Impression: Technically successful ultrasound guided injection.

## 2016-05-15 ENCOUNTER — Ambulatory Visit (INDEPENDENT_AMBULATORY_CARE_PROVIDER_SITE_OTHER): Payer: Medicare HMO | Admitting: Sports Medicine

## 2016-05-15 ENCOUNTER — Encounter: Payer: Self-pay | Admitting: Sports Medicine

## 2016-05-15 DIAGNOSIS — M1712 Unilateral primary osteoarthritis, left knee: Secondary | ICD-10-CM | POA: Diagnosis not present

## 2016-05-15 NOTE — Progress Notes (Signed)
  Procedure: Real-time Ultrasound Guided aspiration/injection of left knee Device: GE Logiq E  Verbal informed consent obtained.  Time-out conducted.  Noted no overlying erythema, induration, or other signs of local infection.  Skin prepped in a sterile fashion.  Local anesthesia: Topical Ethyl chloride.  With sterile technique and under real time ultrasound guidance:  Aspirated 16 mL minimally cloudy straw-colored fluid, syringe switched and 30 mg/2 mL of OrthoVisc (sodium hyaluronate) in a prefilled syringe was injected easily into the knee through a 18-gauge needle. Completed without difficulty  Pain immediately resolved suggesting accurate placement of the medication.  Advised to call if fevers/chills, erythema, induration, drainage, or persistent bleeding.  Images permanently stored and available for review in the ultrasound unit.  Impression: Technically successful ultrasound guided injection.

## 2016-05-15 NOTE — Assessment & Plan Note (Signed)
Orthovisc injection #4 of 4, return as needed. Starting to feel significantly better after #3.

## 2016-05-18 ENCOUNTER — Ambulatory Visit
Admission: RE | Admit: 2016-05-18 | Discharge: 2016-05-18 | Disposition: A | Discharge status: Home / Self Care | Payer: Medicare HMO | Source: Ambulatory Visit | Attending: Sports Medicine | Admitting: Sports Medicine

## 2016-05-18 MED ORDER — METHYLPREDNISOLONE ACETATE 40 MG/ML INJ SUSP (RADIOLOG
120.0000 mg | Freq: Once | INTRAMUSCULAR | Status: AC
  Administered 2016-05-18: 120 mg via EPIDURAL

## 2016-05-18 MED ORDER — IOPAMIDOL (ISOVUE-M 200) INJECTION 41%
1.0000 mL | Freq: Once | INTRAMUSCULAR | Status: AC
  Administered 2016-05-18: 1 mL via EPIDURAL

## 2016-05-18 NOTE — Discharge Instructions (Signed)

## 2016-08-03 ENCOUNTER — Ambulatory Visit (INDEPENDENT_AMBULATORY_CARE_PROVIDER_SITE_OTHER): Payer: Medicare HMO | Admitting: Family Medicine

## 2016-08-03 ENCOUNTER — Encounter: Payer: Self-pay | Admitting: Family Medicine

## 2016-08-03 VITALS — BP 131/64 | HR 80 | Ht 65.5 in | Wt 238.0 lb

## 2016-08-03 DIAGNOSIS — M1712 Unilateral primary osteoarthritis, left knee: Secondary | ICD-10-CM | POA: Diagnosis not present

## 2016-08-03 DIAGNOSIS — M48062 Spinal stenosis, lumbar region with neurogenic claudication: Secondary | ICD-10-CM | POA: Diagnosis not present

## 2016-08-03 NOTE — Progress Notes (Signed)
Karina Brown is a 66 y.o. female who presents to [facility] today for left knee and right sided low back pain.  Patient has chronic left knee pain. She's had multiple steroid injections and most recently completed a course of viscous supplementation for left knee DJD in April 2018. These treatments have not lasted very long. She only has a few weeks of relief.  She notes chronic daily pain is interfering with her ability to exercise ambulate normally.  Additionally patient has chronic back pain occasionally radicular in nature. She's had a workup showing spinal stenosis with some neurogenic claudication symptoms. She's had epidural steroid injections that helped the radicular symptoms but not the actual back pain. She complains today of long-standing right-sided low back pain radiating to the right posterior thigh. She denies any new weakness or numbness or bowel bladder dysfunction. She notes the pain is worse with prolonged standing. She has the pain worsened recently after she had to stand for long period of time work. She has tried medications over-the-counter for pain which have not helped very well.   Past Medical History:  Diagnosis Date  . Lumbar spinal stenosis 12/24/2015  . Primary osteoarthritis of left knee 12/24/2015   Disc with x-rays of the back and knees return to patient   No past surgical history on file. Social History  Substance Use Topics  . Smoking status: Never Smoker  . Smokeless tobacco: Never Used  . Alcohol use Not on file     ROS:  As above   Medications: Current Outpatient Prescriptions  Medication Sig Dispense Refill  . acetaminophen (TYLENOL) 650 MG CR tablet Take 1 tablet (650 mg total) by mouth every 8 (eight) hours as needed for pain. 90 tablet 3  . amLODipine (NORVASC) 5 MG tablet TAKE 1 TABLET BY MOUTH EVERY DAY    . aspirin 81 MG tablet Take by mouth.    Marland Kitchen buPROPion (WELLBUTRIN XL) 300 MG 24 hr tablet  TAKE 1 TABLET(300 MG) BY MOUTH DAILY 90 tablet 1  . Cholecalciferol (VITAMIN D) 2000 units CAPS Take by mouth.    . Cyanocobalamin (B-12) 2000 MCG TABS Take by mouth.    . gabapentin (NEURONTIN) 300 MG capsule Take 2 capsules (600 mg total) by mouth 3 (three) times daily. 90 capsule 3  . lamoTRIgine (LAMICTAL) 100 MG tablet Take by mouth.    . losartan-hydrochlorothiazide (HYZAAR) 100-12.5 MG tablet Take by mouth.    . metFORMIN (GLUCOPHAGE-XR) 500 MG 24 hr tablet TAKE 1 TABLET (500 MG TOTAL) BY MOUTH DAILY WITH BREAKFAST    . Misc Natural Products (COSAMIN ASU ADVANCED FORMULA) CAPS Take 2 tablets by mouth 2 (two) times daily. 120 capsule 11  . naproxen (NAPROSYN) 500 MG tablet Take 1 tablet (500 mg total) by mouth 2 (two) times daily with a meal. 60 tablet 3  . Omega-3 Fatty Acids (FISH OIL OMEGA-3) 1000 MG CAPS Take by mouth.    . potassium chloride (K-DUR) 10 MEQ tablet Take by mouth.    . solifenacin (VESICARE) 5 MG tablet Take by mouth.    . venlafaxine XR (EFFEXOR-XR) 75 MG 24 hr capsule TAKE 3 CAPSULES BY MOUTH EVERY DAY     No current facility-administered medications for this visit.    Allergies  Allergen Reactions  . Pravastatin Other (See Comments)    Elevated CK  . Lexapro  [Escitalopram Oxalate]   . Rosuvastatin Calcium   . Voltaren  [Diclofenac Sodium] Other (See Comments)  .  Zoloft  [Sertraline Hcl]   . Doxycycline Nausea Only  . Lisinopril Cough  . Olanzapine Swelling  . Simvastatin Other (See Comments)    Per pt it causes muscle pain     Exam:  BP 131/64   Pulse 80   Ht 5' 5.5" (1.664 m)   Wt 238 lb (108 kg)   BMI 39.00 kg/m  General: Well Developed, well nourished, and in no acute distress.  Neuro/Psych: Alert and oriented x3, extra-ocular muscles intact, able to move all 4 extremities, sensation grossly intact. Skin: Warm and dry, no rashes noted.  Respiratory: Not using accessory muscles, speaking in full sentences, trachea midline.  Cardiovascular:  Pulses palpable, no extremity edema. Abdomen: Does not appear distended. MSK:  Left knee no erythema mild effusion Range of motion 5-120 with retropatellar crepitations Nontender Stable ligamentous exam  L spine: Nontender to spinal midline. Tender palpation right sided lumbar paraspinal muscle group and into the right SI joint region  Decreased range of motion.  Lower extremity strength is intact.  X-ray left knee dated 12/24/2015 showing end-stage DJD reviewed  MRI L-spine dated 03/09/2016 reviewed showing considerable lumbar spinal stenosis.  Procedure: Real-time Ultrasound Guided Injection of left knee  Device: GE Logiq E  Images permanently stored and available for review in the ultrasound unit. Verbal informed consent obtained. Discussed risks and benefits of procedure. Warned about infection bleeding damage to structures skin hypopigmentation and fat atrophy among others. Patient expresses understanding and agreement Time-out conducted.  Noted no overlying erythema, induration, or other signs of local infection.  Skin prepped in a sterile fashion.  Local anesthesia: Topical Ethyl chloride.  With sterile technique and under real time ultrasound guidance: 80mg  depomedrol and 4ml marcaine injected easily.  Completed without difficulty  Pain immediately resolved suggesting accurate placement of the medication.  Advised to call if fevers/chills, erythema, induration, drainage, or persistent bleeding.  Images permanently stored and available for review in the ultrasound unit.  Impression: Technically successful ultrasound guided injection.     No results found for this or any previous visit (from the past 48 hour(s)). No results found.    Assessment and Plan: 66 y.o. female with  Left knee pain due to DJD. Patient essentially is failing conservative management. I think it's time to refer for orthopedic surgical evaluation for total knee replacement. Steroid  injection today for temporary pain relief.  Lumbar pain is chronic in nature but worse recently. Patient has extensive degenerative changes in her lumbar spine. She has multiple potential pain generators but I think the facet joints of the most likely cause. We'll proceed with multilevel facet injections from L2-3, L3-4, L4-5. Were trying to identify the pain generator for potential ablation in the near future if facet injections do not provide lasting relief.    Orders Placed This Encounter  Procedures  . DG FACET JT INJ L /S SINGLE LEVEL LEFT W/FL/CT    Order Specific Question:   Reason for exam:    Answer:   eval and treat rt sidded pain generator Levels 2/3, 3/4 4/5 BL please    Order Specific Question:   Preferred imaging location?    Answer:   [facility]  . DG FACET JT INJ L /S SINGLE LEVEL RIGHT W/FL/CT    Order Specific Question:   Reason for exam:    Answer:   eval and treat rt sidded pain generator Levels 2/3, 3/4 4/5 BL please    Order Specific Question:   Preferred imaging location?  Answer:   [facility]  . DG FACET JT INJ L /S 2ND LEVEL RIGHT W/FL/CT    Standing Status:   Future    Standing Expiration Date:   10/04/2017    Order Specific Question:   Reason for Exam (SYMPTOM  OR DIAGNOSIS REQUIRED)    Answer:   eval and treat rt sidded pain generator Levels 2/3, 3/4 4/5 BL please    Order Specific Question:   Preferred imaging location?    Answer:   [facility]  . DG FACET JT INJ L /S  2ND LEVEL LEFT W/FL/CT    Standing Status:   Future    Standing Expiration Date:   10/04/2017    Order Specific Question:   Reason for Exam (SYMPTOM  OR DIAGNOSIS REQUIRED)    Answer:   eval and treat rt sidded pain generator Levels 2/3, 3/4 4/5 BL please    Order Specific Question:   Preferred imaging location?    Answer:   [facility]  . Ambulatory referral to Orthopedic Surgery    Referral Priority:   Routine    Referral Type:   Surgical    Referral Reason:    Specialty Services Required    Requested Specialty:   Orthopedic Surgery    Number of Visits Requested:   1   No orders of the defined types were placed in this encounter.   Discussed warning signs or symptoms. Please see discharge instructions. Patient expresses understanding.   I spent 40 minutes with this patient, greater than 50% was face-to-face time counseling regarding the above diagnosis.

## 2016-08-03 NOTE — Patient Instructions (Addendum)
Thank you for coming in today. [facility].  Let me know if you do not hear anything.   [facility] should also hear from [facility] about the back injections as well.   Recheck with me or Dr T in about 4 weeks to check up on your back and your knee.   Call or go to the ER if you develop a large red swollen joint with extreme pain or oozing puss.

## 2016-08-18 ENCOUNTER — Telehealth: Payer: Self-pay

## 2016-08-18 NOTE — Telephone Encounter (Signed)
Victorino DikeJennifer from [facility] is trying to get a PA approved for Facet injections. [employer] is requiring additional information from provider. Peer to Peer is needed.    [employer] phone: [phone] Case # [id number]  Need the authorization #  Call Victorino DikeJennifer when authorization is approved.   [phone]

## 2016-08-18 NOTE — Telephone Encounter (Signed)
Authorized with number [id number]  08/10/16 --- 11/08/16

## 2016-08-18 NOTE — Telephone Encounter (Signed)
Left a message for Victorino [facility]Jennifer at [facility] advising of the auth #.

## 2016-08-19 NOTE — Telephone Encounter (Signed)
Authorization # [id number]

## 2016-08-24 ENCOUNTER — Ambulatory Visit
Admission: RE | Admit: 2016-08-24 | Discharge: 2016-08-24 | Disposition: A | Discharge status: Home / Self Care | Payer: Medicare HMO | Source: Ambulatory Visit | Attending: Family Medicine | Admitting: Family Medicine

## 2016-08-24 DIAGNOSIS — M48062 Spinal stenosis, lumbar region with neurogenic claudication: Secondary | ICD-10-CM

## 2016-08-24 MED ORDER — METHYLPREDNISOLONE ACETATE 40 MG/ML INJ SUSP (RADIOLOG
120.0000 mg | Freq: Once | INTRAMUSCULAR | Status: AC
  Administered 2016-08-24: 120 mg via INTRA_ARTICULAR

## 2016-08-24 MED ORDER — IOPAMIDOL (ISOVUE-M 200) INJECTION 41%
1.0000 mL | Freq: Once | INTRAMUSCULAR | Status: AC
  Administered 2016-08-24: 1 mL via INTRA_ARTICULAR

## 2016-08-24 NOTE — Discharge Instructions (Signed)

## 2016-08-31 ENCOUNTER — Ambulatory Visit: Payer: Medicare HMO | Admitting: Family Medicine

## 2016-09-18 ENCOUNTER — Telehealth: Payer: Self-pay | Admitting: Family Medicine

## 2016-09-18 NOTE — Telephone Encounter (Signed)
Dr T: Pt called. She's still having pain in back and knees, back pain is worse. She wants to be referred to [facility]/fax [phone].  Thank you.

## 2016-09-19 NOTE — Telephone Encounter (Signed)
She recently had facet injections and has not followed up, also she is already been referred to orthopedic surgery regarding her knees.  Regarding her knees, does she want just a generic [facility] orthopedist to discuss knee replacement with instead of Dr. Autumn MessingHowe?  Regarding her back, she needs to follow-up without a myself or Dr. Denyse Amassorey to discuss her response to the facet joint injections before we can determine who she needs to be referred to.

## 2016-09-21 NOTE — Telephone Encounter (Signed)
I gave patient your message and she will call next week  for an appointment with you or Dr Denyse Amassorey.

## 2016-09-28 DIAGNOSIS — Z789 Other specified health status: Secondary | ICD-10-CM | POA: Insufficient documentation

## 2016-09-29 ENCOUNTER — Other Ambulatory Visit (HOSPITAL_COMMUNITY)
Admission: RE | Admit: 2016-09-29 | Discharge: 2016-09-29 | Disposition: A | Discharge status: Home / Self Care | Payer: Medicare HMO | Source: Ambulatory Visit | Attending: Obstetrics and Gynecology | Admitting: Obstetrics and Gynecology

## 2016-09-29 ENCOUNTER — Ambulatory Visit (INDEPENDENT_AMBULATORY_CARE_PROVIDER_SITE_OTHER): Payer: Medicare HMO | Admitting: Obstetrics and Gynecology

## 2016-09-29 ENCOUNTER — Encounter: Payer: Self-pay | Admitting: Obstetrics and Gynecology

## 2016-09-29 VITALS — BP 123/74 | HR 101 | Resp 16 | Ht 65.5 in | Wt 235.0 lb

## 2016-09-29 DIAGNOSIS — Z01419 Encounter for gynecological examination (general) (routine) without abnormal findings: Secondary | ICD-10-CM | POA: Diagnosis present

## 2016-09-29 DIAGNOSIS — Z124 Encounter for screening for malignant neoplasm of cervix: Secondary | ICD-10-CM

## 2016-09-29 DIAGNOSIS — Z1151 Encounter for screening for human papillomavirus (HPV): Secondary | ICD-10-CM | POA: Diagnosis not present

## 2016-09-29 NOTE — Progress Notes (Signed)
Subjective:     Karina Brown is a 65 y.o. female G66P4 with BMI 38.4 who is here for a comprehensive physical exam. The patient reports no problems. She has been menopausal for several years and denies any vaginal bleeding. She denies pelvic pain or abnormal discharge. Patient reports some urinary incontinence. She admits to drinking a lot of juice and soft drinks and knows that it is a contributing factor. Patient reports a history of abnormal cells on her pelvis. She reports normal labs with PCP yesterday and is scheduled for mammogram next month  Past Medical History:  Diagnosis Date  . Lumbar spinal stenosis 12/24/2015  . Primary osteoarthritis of left knee 12/24/2015   Disc with x-rays of the back and knees return to patient   History reviewed. No pertinent surgical history. Family History  Problem Relation Age of Onset  . Heart disease Maternal Grandmother   . Stomach cancer Maternal Aunt      Social History   Social History  . Marital status: Widowed    Spouse name: N/A  . Number of children: N/A  . Years of education: N/A   Occupational History  . Not on file.   Social History Main Topics  . Smoking status: Never Smoker  . Smokeless tobacco: Never Used  . Alcohol use No  . Drug use: No  . Sexual activity: No   Other Topics Concern  . Not on file   Social History Narrative  . No narrative on file   Health Maintenance  Topic Date Due  . Hepatitis C Screening  31-Jan-1951  . TETANUS/TDAP  09/17/1969  . MAMMOGRAM  09/17/2000  . COLONOSCOPY  09/17/2000  . DEXA SCAN  09/18/2015  . PNA vac Low Risk Adult (1 of 2 - PCV13) 09/18/2015  . INFLUENZA VACCINE  09/09/2016       Review of Systems Pertinent items are noted in HPI.   Objective:  Blood pressure 123/74, pulse (!) 101, resp. rate 16, height 5' 5.5" (1.664 m), weight 235 lb (106.6 kg).     GENERAL: Well-developed, well-nourished female in no acute distress.  HEENT: Normocephalic, atraumatic. Sclerae  anicteric.  NECK: Supple. Normal thyroid.  LUNGS: Clear to auscultation bilaterally.  HEART: Regular rate and rhythm. BREASTS: Symmetric in size. No palpable masses or lymphadenopathy, skin changes, or nipple drainage. ABDOMEN: Soft, nontender, nondistended. Obese PELVIC: Normal external female genitalia. Vagina is pink and rugated.  Normal discharge. Normal appearing cervix. Uterus is normal in size. No adnexal mass or tenderness. EXTREMITIES: No cyanosis, clubbing, or edema, 2+ distal pulses.    Assessment:    Healthy female exam.      Plan:    Pap smear collected Patient scheduled for screening mammogram Patient will be contacted with abnormal results RTC prn See After Visit Summary for Counseling Recommendations

## 2016-10-02 LAB — CYTOLOGY - PAP
Diagnosis: NEGATIVE
HPV: NOT DETECTED

## 2016-10-22 DIAGNOSIS — M4150 Other secondary scoliosis, site unspecified: Secondary | ICD-10-CM | POA: Insufficient documentation

## 2016-10-22 DIAGNOSIS — M4180 Other forms of scoliosis, site unspecified: Secondary | ICD-10-CM | POA: Insufficient documentation

## 2016-10-22 DIAGNOSIS — M8580 Other specified disorders of bone density and structure, unspecified site: Secondary | ICD-10-CM | POA: Insufficient documentation

## 2017-02-24 DIAGNOSIS — M8950 Osteolysis, unspecified site: Secondary | ICD-10-CM | POA: Insufficient documentation

## 2017-03-31 DIAGNOSIS — Z981 Arthrodesis status: Secondary | ICD-10-CM | POA: Insufficient documentation

## 2017-03-31 DIAGNOSIS — M79605 Pain in left leg: Secondary | ICD-10-CM | POA: Insufficient documentation

## 2018-03-22 ENCOUNTER — Ambulatory Visit: Payer: Medicare HMO | Admitting: Sports Medicine

## 2018-03-23 ENCOUNTER — Encounter: Payer: Self-pay | Admitting: Sports Medicine

## 2018-03-23 ENCOUNTER — Ambulatory Visit (INDEPENDENT_AMBULATORY_CARE_PROVIDER_SITE_OTHER): Payer: Medicare HMO

## 2018-03-23 ENCOUNTER — Ambulatory Visit (INDEPENDENT_AMBULATORY_CARE_PROVIDER_SITE_OTHER): Payer: Medicare HMO | Admitting: Sports Medicine

## 2018-03-23 DIAGNOSIS — M1712 Unilateral primary osteoarthritis, left knee: Secondary | ICD-10-CM | POA: Diagnosis not present

## 2018-03-23 DIAGNOSIS — M170 Bilateral primary osteoarthritis of knee: Secondary | ICD-10-CM

## 2018-03-23 NOTE — Assessment & Plan Note (Signed)
Did well with viscosupplementation 2 years ago. Steroid injection today, OA unloader knee brace. Updated x-rays. I am going to get her approved for Visco supplementation again. Her insurance did not cover genicular RFA. She is approaching arthroplasty.

## 2018-03-23 NOTE — Progress Notes (Signed)
Subjective:    CC: Left knee pain  HPI: Karina Brown returns, she is a very pleasant 68 year old female, we have been treating her in the past for left knee pain, she has osteoarthritis, she responded well to Orthovisc about 2 years ago.  Subsequently she had a return of pain and so we referred her to discuss genicular RFA.  Unfortunately her insurance did not cover this procedure.  She is now having a recurrence of pain, moderate, persistent, localized to the medial joint line without radiation, mechanical symptoms.  I reviewed the past medical history, family history, social history, surgical history, and allergies today and no changes were needed.  Please see the problem list section below in epic for further details.  Past Medical History: Past Medical History:  Diagnosis Date  . Lumbar spinal stenosis 12/24/2015  . Primary osteoarthritis of left knee 12/24/2015   Disc with x-rays of the back and knees return to patient   Past Surgical History: No past surgical history on file. Social History: Social History   Socioeconomic History  . Marital status: Widowed    Spouse name: Not on file  . Number of children: Not on file  . Years of education: Not on file  . Highest education level: Not on file  Occupational History  . Not on file  Social Needs  . Financial resource strain: Not on file  . Food insecurity:    Worry: Not on file    Inability: Not on file  . Transportation needs:    Medical: Not on file    Non-medical: Not on file  Tobacco Use  . Smoking status: Never Smoker  . Smokeless tobacco: Never Used  Substance and Sexual Activity  . Alcohol use: No  . Drug use: No  . Sexual activity: Never  Lifestyle  . Physical activity:    Days per week: Not on file    Minutes per session: Not on file  . Stress: Not on file  Relationships  . Social connections:    Talks on phone: Not on file    Gets together: Not on file    Attends religious service: Not on file    Active  member of club or organization: Not on file    Attends meetings of clubs or organizations: Not on file    Relationship status: Not on file  Other Topics Concern  . Not on file  Social History Narrative  . Not on file   Family History: Family History  Problem Relation Age of Onset  . Heart disease Maternal Grandmother   . Stomach cancer Maternal Aunt    Allergies: Allergies  Allergen Reactions  . Pravastatin Other (See Comments)    Elevated CK  . Lexapro  [Escitalopram Oxalate]   . Rosuvastatin Calcium   . Voltaren  [Diclofenac Sodium] Other (See Comments)  . Zoloft  [Sertraline Hcl]   . Doxycycline Nausea Only  . Lisinopril Cough  . Olanzapine Swelling  . Simvastatin Other (See Comments)    Per pt it causes muscle pain   Medications: See med rec.  Review of Systems: No fevers, chills, night sweats, weight loss, chest pain, or shortness of breath.   Objective:    General: Well Developed, well nourished, and in no acute distress.  Neuro: Alert and oriented x3, extra-ocular muscles intact, sensation grossly intact.  HEENT: Normocephalic, atraumatic, pupils equal round reactive to light, neck supple, no masses, no lymphadenopathy, thyroid nonpalpable.  Skin: Warm and dry, no rashes. Cardiac: Regular rate and  rhythm, no murmurs rubs or gallops, no lower extremity edema.  Respiratory: Clear to auscultation bilaterally. Not using accessory muscles, speaking in full sentences. Left knee: Normal to inspection with no erythema or effusion or obvious bony abnormalities. Varus deformity, tender at the medial joint line ROM normal in flexion and extension and lower leg rotation. Ligaments with solid consistent endpoints including ACL, PCL, LCL, MCL. Negative Mcmurray's and provocative meniscal tests. Non painful patellar compression. Patellar and quadriceps tendons unremarkable. Hamstring and quadriceps strength is normal.  Procedure: Real-time Ultrasound Guided Injection of  left knee Device: GE Logiq E  Verbal informed consent obtained.  Time-out conducted.  Noted no overlying erythema, induration, or other signs of local infection.  Skin prepped in a sterile fashion.  Local anesthesia: Topical Ethyl chloride.  With sterile technique and under real time ultrasound guidance: 1 cc Kenalog 40, 2 cc lidocaine, 2 cc bupivacaine injected easily Completed without difficulty  Pain immediately resolved suggesting accurate placement of the medication.  Advised to call if fevers/chills, erythema, induration, drainage, or persistent bleeding.  Images permanently stored and available for review in the ultrasound unit.  Impression: Technically successful ultrasound guided injection.  Impression and Recommendations:    Primary osteoarthritis of left knee Did well with viscosupplementation 2 years ago. Steroid injection today, OA unloader knee brace. Updated x-rays. I am going to get her approved for Visco supplementation again. Her insurance did not cover genicular RFA. She is approaching arthroplasty. ___________________________________________ Ihor Austin. Benjamin Stain, M.D., ABFM., CAQSM. Primary Care and Sports Medicine [facility]  Adjunct Professor of Family Medicine  [employer]

## 2018-03-28 ENCOUNTER — Telehealth: Payer: Self-pay | Admitting: Sports Medicine

## 2018-03-28 NOTE — Telephone Encounter (Signed)
-----   Message from Neldon Labella, CMA sent at 03/24/2018  9:10 AM EST ----- Information has been submitted to Orthovisc and awaiting determination.   ----- Message ----- From: Monica Becton, MD Sent: 03/23/2018  11:28 AM EST To: Neldon Labella, CMA  Orthovisc approval please, left knee, x-ray confirmed, failed steroid injections, good response to years ago. ___________________________________________Thomas Shela Commons. Benjamin Stain, M.D., ABFM., CAQSM.[facility] and [facility] MedicineC[facility] KernersvilleAdjunct Professor of Family Medicine [employer]

## 2018-03-31 NOTE — Telephone Encounter (Signed)
Received a form from insurance requeting additional information and provider signature. It has been faxed and waiting on a response.

## 2018-04-01 NOTE — Telephone Encounter (Signed)
Orthovisc is covered.  A copay applies for the administration. This also covers the office visit copay if collected. The copay amount is $25.00. Injection Copay applies to each successive injection visit. The deductible does not apply, but the patient has a coinsurance of 20% of the allowable amount. Once the out of pocket is met, the patient will have no financial responsibility. Call reference number is [id number]/  Received an approval from [facility] for the knee injections and patient is agreeable to pay the estimated out of pocket. Patient has an appointment for first injection.

## 2018-04-05 ENCOUNTER — Ambulatory Visit: Payer: Medicare HMO | Admitting: Sports Medicine

## 2018-04-15 DIAGNOSIS — N183 Chronic kidney disease, stage 3 unspecified: Secondary | ICD-10-CM | POA: Insufficient documentation

## 2018-04-26 ENCOUNTER — Other Ambulatory Visit: Payer: Self-pay

## 2018-04-26 ENCOUNTER — Ambulatory Visit (INDEPENDENT_AMBULATORY_CARE_PROVIDER_SITE_OTHER): Payer: Medicare HMO | Admitting: Sports Medicine

## 2018-04-26 DIAGNOSIS — M1712 Unilateral primary osteoarthritis, left knee: Secondary | ICD-10-CM

## 2018-04-26 NOTE — Assessment & Plan Note (Signed)
Orthovisc #1 of 4 into the left knee. Return in 1 week for Orthovisc No. 2 of 4 into the left knee.

## 2018-04-26 NOTE — Progress Notes (Signed)
   Procedure: Real-time Ultrasound Guided injection of the left knee Device: GE Logiq E  Verbal informed consent obtained.  Time-out conducted.  Noted no overlying erythema, induration, or other signs of local infection.  Skin prepped in a sterile fashion.  Local anesthesia: Topical Ethyl chloride.  With sterile technique and under real time ultrasound guidance:  30 mg/2 mL of OrthoVisc (sodium hyaluronate) in a prefilled syringe was injected easily into the knee through a 22-gauge needle. Completed without difficulty  Pain immediately resolved suggesting accurate placement of the medication.  Advised to call if fevers/chills, erythema, induration, drainage, or persistent bleeding.  Images permanently stored and available for review in the ultrasound unit.  Impression: Technically successful ultrasound guided injection. 

## 2018-05-03 ENCOUNTER — Ambulatory Visit: Payer: Medicare HMO | Admitting: Sports Medicine

## 2018-05-06 ENCOUNTER — Ambulatory Visit: Payer: Medicare HMO | Admitting: Sports Medicine

## 2018-05-10 ENCOUNTER — Ambulatory Visit (INDEPENDENT_AMBULATORY_CARE_PROVIDER_SITE_OTHER): Payer: Medicare HMO | Admitting: Sports Medicine

## 2018-05-10 ENCOUNTER — Other Ambulatory Visit: Payer: Self-pay

## 2018-05-10 DIAGNOSIS — M1712 Unilateral primary osteoarthritis, left knee: Secondary | ICD-10-CM

## 2018-05-10 NOTE — Assessment & Plan Note (Signed)
Orthovisc No. 2 of 4 into the left knee, return in 1 week for #3.

## 2018-05-10 NOTE — Progress Notes (Signed)
   Procedure: Real-time Ultrasound Guided injection of the left knee Device: GE Logiq E  Verbal informed consent obtained.  Time-out conducted.  Noted no overlying erythema, induration, or other signs of local infection.  Skin prepped in a sterile fashion.  Local anesthesia: Topical Ethyl chloride.  With sterile technique and under real time ultrasound guidance:  30 mg/2 mL of OrthoVisc (sodium hyaluronate) in a prefilled syringe was injected easily into the knee through a 22-gauge needle. Completed without difficulty  Pain immediately resolved suggesting accurate placement of the medication.  Advised to call if fevers/chills, erythema, induration, drainage, or persistent bleeding.  Images permanently stored and available for review in the ultrasound unit.  Impression: Technically successful ultrasound guided injection. 

## 2018-05-17 ENCOUNTER — Other Ambulatory Visit: Payer: Self-pay

## 2018-05-17 ENCOUNTER — Ambulatory Visit (INDEPENDENT_AMBULATORY_CARE_PROVIDER_SITE_OTHER): Payer: Medicare HMO | Admitting: Sports Medicine

## 2018-05-17 DIAGNOSIS — M1712 Unilateral primary osteoarthritis, left knee: Secondary | ICD-10-CM | POA: Diagnosis not present

## 2018-05-17 NOTE — Progress Notes (Signed)
   Procedure: Real-time Ultrasound Guided injection of the left knee Device: GE Logiq E  Verbal informed consent obtained.  Time-out conducted.  Noted no overlying erythema, induration, or other signs of local infection.  Skin prepped in a sterile fashion.  Local anesthesia: Topical Ethyl chloride.  With sterile technique and under real time ultrasound guidance:  30 mg/2 mL of OrthoVisc (sodium hyaluronate) in a prefilled syringe was injected easily into the knee through a 22-gauge needle. Completed without difficulty  Pain immediately resolved suggesting accurate placement of the medication.  Advised to call if fevers/chills, erythema, induration, drainage, or persistent bleeding.  Images permanently stored and available for review in the ultrasound unit.  Impression: Technically successful ultrasound guided injection. 

## 2018-05-17 NOTE — Assessment & Plan Note (Signed)
Orthovisc injection #3 of 4 into the left knee, return in 1 week for #4 of 4. 

## 2018-05-24 ENCOUNTER — Ambulatory Visit: Payer: Medicare HMO | Admitting: Sports Medicine

## 2018-05-25 ENCOUNTER — Ambulatory Visit (INDEPENDENT_AMBULATORY_CARE_PROVIDER_SITE_OTHER): Payer: Medicare HMO | Admitting: Sports Medicine

## 2018-05-25 DIAGNOSIS — M48062 Spinal stenosis, lumbar region with neurogenic claudication: Secondary | ICD-10-CM | POA: Diagnosis not present

## 2018-05-25 DIAGNOSIS — M4807 Spinal stenosis, lumbosacral region: Secondary | ICD-10-CM | POA: Diagnosis not present

## 2018-05-25 DIAGNOSIS — M1712 Unilateral primary osteoarthritis, left knee: Secondary | ICD-10-CM | POA: Diagnosis not present

## 2018-05-25 MED ORDER — PREDNISONE 50 MG PO TABS: ORAL_TABLET | ORAL | 0 refills | Status: DC

## 2018-05-25 NOTE — Assessment & Plan Note (Signed)
Orthovisc No. 4 of 4 into the left knee, return as needed. 

## 2018-05-25 NOTE — Assessment & Plan Note (Signed)
Axial pain, facet agenic versus spinal stenosis. Has had some injections and epidurals, unclear regarding her relief. She also had a lumbar fusion in the past. Repeat x-rays, MRI for further interventional planning. Adding 5 days of prednisone to calm down her pain very quickly in the meantime.

## 2018-05-25 NOTE — Progress Notes (Addendum)
Subjective:    CC: Multiple issues  HPI: Knee osteoarthritis: Here for Orthovisc No. 4 of 4 in the left knee, pain-free now for the most part.  Low back pain: Axial, worse with standing straight, left-sided, upper lumbar spine.  She has had some injections in the past with questionable relief.  No bowel or bladder dysfunction, saddle numbness, no constitutional symptoms.  Nothing overtly radicular.  I reviewed the past medical history, family history, social history, surgical history, and allergies today and no changes were needed.  Please see the problem list section below in epic for further details.  Past Medical History: Past Medical History:  Diagnosis Date  . Lumbar spinal stenosis 12/24/2015  . Primary osteoarthritis of left knee 12/24/2015   Disc with x-rays of the back and knees return to patient   Past Surgical History: No past surgical history on file. Social History: Social History   Socioeconomic History  . Marital status: Widowed    Spouse name: Not on file  . Number of children: Not on file  . Years of education: Not on file  . Highest education level: Not on file  Occupational History  . Not on file  Social Needs  . Financial resource strain: Not on file  . Food insecurity:    Worry: Not on file    Inability: Not on file  . Transportation needs:    Medical: Not on file    Non-medical: Not on file  Tobacco Use  . Smoking status: Never Smoker  . Smokeless tobacco: Never Used  Substance and Sexual Activity  . Alcohol use: No  . Drug use: No  . Sexual activity: Never  Lifestyle  . Physical activity:    Days per week: Not on file    Minutes per session: Not on file  . Stress: Not on file  Relationships  . Social connections:    Talks on phone: Not on file    Gets together: Not on file    Attends religious service: Not on file    Active member of club or organization: Not on file    Attends meetings of clubs or organizations: Not on file   Relationship status: Not on file  Other Topics Concern  . Not on file  Social History Narrative  . Not on file   Family History: Family History  Problem Relation Age of Onset  . Heart disease Maternal Grandmother   . Stomach cancer Maternal Aunt    Allergies: Allergies  Allergen Reactions  . Pravastatin Other (See Comments)    Elevated CK  . Lexapro  [Escitalopram Oxalate]   . Rosuvastatin Calcium   . Doxycycline Nausea Only  . Lisinopril Cough  . Olanzapine Swelling  . Simvastatin Other (See Comments)    Per pt it causes muscle pain  . Voltaren [Diclofenac Sodium] Other (See Comments)  . Zoloft [Sertraline Hcl]    Medications: See med rec.  Review of Systems: No fevers, chills, night sweats, weight loss, chest pain, or shortness of breath.   Objective:    General: Well Developed, well nourished, and in no acute distress.  Neuro: Alert and oriented x3, extra-ocular muscles intact, sensation grossly intact.  HEENT: Normocephalic, atraumatic, pupils equal round reactive to light, neck supple, no masses, no lymphadenopathy, thyroid nonpalpable.  Skin: Warm and dry, no rashes. Cardiac: Regular rate and rhythm, no murmurs rubs or gallops, no lower extremity edema.  Respiratory: Clear to auscultation bilaterally. Not using accessory muscles, speaking in full sentences.  Procedure:  Real-time Ultrasound Guided injection of the left knee Device: GE Logiq E  Verbal informed consent obtained.  Time-out conducted.  Noted no overlying erythema, induration, or other signs of local infection.  Skin prepped in a sterile fashion.  Local anesthesia: Topical Ethyl chloride.  With sterile technique and under real time ultrasound guidance:  30 mg/2 mL of OrthoVisc (sodium hyaluronate) in a prefilled syringe was injected easily into the knee through a 22-gauge needle. Completed without difficulty  Pain immediately resolved suggesting accurate placement of the medication.  Advised to call  if fevers/chills, erythema, induration, drainage, or persistent bleeding.  Images permanently stored and available for review in the ultrasound unit.  Impression: Technically successful ultrasound guided injection.  Impression and Recommendations:    Primary osteoarthritis of left knee Orthovisc No. 4 of 4 into the left knee, return as needed.  Lumbar spinal stenosis Axial pain, facet agenic versus spinal stenosis. Has had some injections and epidurals, unclear regarding her relief. She also had a lumbar fusion in the past. Repeat x-rays, MRI for further interventional planning. Adding 5 days of prednisone to calm down her pain very quickly in the meantime.   ___________________________________________ Ihor Austin. Benjamin Stain, M.D., ABFM., CAQSM. Primary Care and Sports Medicine [facility]  Adjunct Professor of Family Medicine  [employer]

## 2018-05-25 NOTE — Addendum Note (Signed)
Addended by: Monica Becton on: 05/25/2018 11:51 AM   Modules accepted: Orders, Level of Service

## 2018-06-19 ENCOUNTER — Ambulatory Visit (INDEPENDENT_AMBULATORY_CARE_PROVIDER_SITE_OTHER): Payer: Medicare HMO

## 2018-06-19 ENCOUNTER — Other Ambulatory Visit: Payer: Self-pay

## 2018-06-19 DIAGNOSIS — M4807 Spinal stenosis, lumbosacral region: Secondary | ICD-10-CM | POA: Diagnosis not present

## 2018-06-19 DIAGNOSIS — M48062 Spinal stenosis, lumbar region with neurogenic claudication: Secondary | ICD-10-CM | POA: Diagnosis not present

## 2018-06-29 ENCOUNTER — Ambulatory Visit: Payer: Medicare HMO | Admitting: Sports Medicine

## 2018-07-07 ENCOUNTER — Ambulatory Visit: Payer: Medicare HMO | Admitting: Sports Medicine

## 2018-07-14 ENCOUNTER — Encounter: Payer: Self-pay | Admitting: Sports Medicine

## 2018-07-14 ENCOUNTER — Ambulatory Visit (INDEPENDENT_AMBULATORY_CARE_PROVIDER_SITE_OTHER): Payer: Medicare HMO | Admitting: Sports Medicine

## 2018-07-14 DIAGNOSIS — M48062 Spinal stenosis, lumbar region with neurogenic claudication: Secondary | ICD-10-CM

## 2018-07-14 NOTE — Progress Notes (Signed)
Subjective:    CC: Follow-up back pain, go over MRI  HPI: Karina Brown is a pleasant 68 year old female, she has an extensive lumbar fusion, not surprisingly has persistent axial low back pain, worse with standing.  Initially she localizes in her upper lumbar spine, but today it is more in the lower lumbar spine, left worse than right with radiation into the buttock but not past the thigh or knee.  Moderate, persistent, localized.  I reviewed the past medical history, family history, social history, surgical history, and allergies today and no changes were needed.  Please see the problem list section below in epic for further details.  Past Medical History: Past Medical History:  Diagnosis Date  . Lumbar spinal stenosis 12/24/2015  . Primary osteoarthritis of left knee 12/24/2015   Disc with x-rays of the back and knees return to patient   Past Surgical History: No past surgical history on file. Social History: Social History   Socioeconomic History  . Marital status: Widowed    Spouse name: Not on file  . Number of children: Not on file  . Years of education: Not on file  . Highest education level: Not on file  Occupational History  . Not on file  Social Needs  . Financial resource strain: Not on file  . Food insecurity:    Worry: Not on file    Inability: Not on file  . Transportation needs:    Medical: Not on file    Non-medical: Not on file  Tobacco Use  . Smoking status: Never Smoker  . Smokeless tobacco: Never Used  Substance and Sexual Activity  . Alcohol use: No  . Drug use: No  . Sexual activity: Never  Lifestyle  . Physical activity:    Days per week: Not on file    Minutes per session: Not on file  . Stress: Not on file  Relationships  . Social connections:    Talks on phone: Not on file    Gets together: Not on file    Attends religious service: Not on file    Active member of club or organization: Not on file    Attends meetings of clubs or organizations:  Not on file    Relationship status: Not on file  Other Topics Concern  . Not on file  Social History Narrative  . Not on file   Family History: Family History  Problem Relation Age of Onset  . Heart disease Maternal Grandmother   . Stomach cancer Maternal Aunt    Allergies: Allergies  Allergen Reactions  . Pravastatin Other (See Comments)    Elevated CK  . Lexapro  [Escitalopram Oxalate]   . Rosuvastatin Calcium   . Doxycycline Nausea Only  . Lisinopril Cough  . Olanzapine Swelling  . Simvastatin Other (See Comments)    Per pt it causes muscle pain  . Voltaren [Diclofenac Sodium] Other (See Comments)  . Zoloft [Sertraline Hcl]    Medications: See med rec.  Review of Systems: No fevers, chills, night sweats, weight loss, chest pain, or shortness of breath.   Objective:    General: Well Developed, well nourished, and in no acute distress.  Neuro: Alert and oriented x3, extra-ocular muscles intact, sensation grossly intact.  HEENT: Normocephalic, atraumatic, pupils equal round reactive to light, neck supple, no masses, no lymphadenopathy, thyroid nonpalpable.  Skin: Warm and dry, no rashes. Cardiac: Regular rate and rhythm, no murmurs rubs or gallops, no lower extremity edema.  Respiratory: Clear to auscultation bilaterally. Not  using accessory muscles, speaking in full sentences. Low back: Tender to palpation of the left and right sacroiliac joints.  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  Procedure: Real-time Ultrasound Guided injection of the left sacroiliac joint Device: GE Logiq E  Verbal informed consent obtained.  Time-out conducted.  Noted no overlying erythema, induration, or other signs of local infection.  Skin prepped in a sterile fashion.  Local anesthesia: Topical Ethyl chloride.  With sterile technique and under real time ultrasound guidance:  1 cc Kenalog 40, 2 see  lidocaine, 2 cc bupivacaine injected easily Completed without difficulty  Pain immediately resolved suggesting accurate placement of the medication.  Advised to call if fevers/chills, erythema, induration, drainage, or persistent bleeding.  Images permanently stored and available for review in the ultrasound unit.  Impression: Technically successful ultrasound guided injection.  Procedure: Real-time Ultrasound Guided injection of the right sacroiliac joint Device: GE Logiq E  Verbal informed consent obtained.  Time-out conducted.  Noted no overlying erythema, induration, or other signs of local infection.  Skin prepped in a sterile fashion.  Local anesthesia: Topical Ethyl chloride.  With sterile technique and under real time ultrasound guidance:  1 cc Kenalog 40, 2 see lidocaine, 2 cc bupivacaine injected easily Completed without difficulty  Pain immediately resolved suggesting accurate placement of the medication.  Advised to call if fevers/chills, erythema, induration, drainage, or persistent bleeding.  Images permanently stored and available for review in the ultrasound unit.  Impression: Technically successful ultrasound guided injection.  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 Today her pain was referrable more to the left greater than right sacroiliac joints. I injected both of her sacroiliac joints under ultrasound guidance and she reported complete and immediate pain relief. Return in 1 month.   ___________________________________________ Ihor Austinhomas J. Benjamin Stainhekkekandam, M.D., ABFM., CAQSM. Primary Care and Sports Medicine [facility]  Adjunct Professor of Family Medicine  [employer]

## 2018-07-14 NOTE — Assessment & Plan Note (Signed)
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 Today her pain was referrable more to the left greater than right sacroiliac joints. I injected both of her sacroiliac joints under ultrasound guidance and she reported complete and immediate pain relief. Return in 1 month.

## 2018-08-15 ENCOUNTER — Ambulatory Visit: Payer: Medicare HMO | Admitting: Sports Medicine

## 2018-08-17 ENCOUNTER — Ambulatory Visit: Payer: Medicare HMO | Admitting: Sports Medicine

## 2018-08-22 ENCOUNTER — Ambulatory Visit: Payer: Medicare HMO | Admitting: Obstetrics & Gynecology

## 2018-08-22 ENCOUNTER — Ambulatory Visit: Payer: Medicare HMO | Admitting: Sports Medicine

## 2018-08-29 ENCOUNTER — Ambulatory Visit (INDEPENDENT_AMBULATORY_CARE_PROVIDER_SITE_OTHER): Payer: Medicare HMO | Admitting: Sports Medicine

## 2018-08-29 ENCOUNTER — Encounter: Payer: Self-pay | Admitting: Sports Medicine

## 2018-08-29 ENCOUNTER — Other Ambulatory Visit: Payer: Self-pay

## 2018-08-29 ENCOUNTER — Ambulatory Visit (INDEPENDENT_AMBULATORY_CARE_PROVIDER_SITE_OTHER): Payer: Medicare HMO

## 2018-08-29 DIAGNOSIS — M1712 Unilateral primary osteoarthritis, left knee: Secondary | ICD-10-CM

## 2018-08-29 DIAGNOSIS — M48062 Spinal stenosis, lumbar region with neurogenic claudication: Secondary | ICD-10-CM

## 2018-08-29 MED ORDER — PREDNISONE 50 MG PO TABS: ORAL_TABLET | ORAL | 0 refills | Status: DC

## 2018-08-29 MED ORDER — HYDROCODONE-ACETAMINOPHEN 5-325 MG PO TABS: 1.0000 | ORAL_TABLET | Freq: Three times a day (TID) | ORAL | 0 refills | Status: DC | PRN

## 2018-08-29 NOTE — Assessment & Plan Note (Addendum)
Orthovisc No. 4 of 4 into the left knee was performed back in April, knees are actually doing okay.

## 2018-08-29 NOTE — Progress Notes (Signed)
Subjective:    CC: Low back pain  HPI: Karina Brown returns, she had a multilevel lumbar decompression with fusion, now having recurrence of back pain, I injected both of her SI joints at the last visit with good initial pain relief but full recurrence of pain.  Her knees are doing well after Orthovisc  I reviewed the past medical history, family history, social history, surgical history, and allergies today and no changes were needed.  Please see the problem list section below in epic for further details.  Past Medical History: Past Medical History:  Diagnosis Date  . Lumbar spinal stenosis 12/24/2015  . Primary osteoarthritis of left knee 12/24/2015   Disc with x-rays of the back and knees return to patient   Past Surgical History: No past surgical history on file. Social History: Social History   Socioeconomic History  . Marital status: Widowed    Spouse name: Not on file  . Number of children: Not on file  . Years of education: Not on file  . Highest education level: Not on file  Occupational History  . Not on file  Social Needs  . Financial resource strain: Not on file  . Food insecurity    Worry: Not on file    Inability: Not on file  . Transportation needs    Medical: Not on file    Non-medical: Not on file  Tobacco Use  . Smoking status: Never Smoker  . Smokeless tobacco: Never Used  Substance and Sexual Activity  . Alcohol use: No  . Drug use: No  . Sexual activity: Never  Lifestyle  . Physical activity    Days per week: Not on file    Minutes per session: Not on file  . Stress: Not on file  Relationships  . Social Herbalist on phone: Not on file    Gets together: Not on file    Attends religious service: Not on file    Active member of club or organization: Not on file    Attends meetings of clubs or organizations: Not on file    Relationship status: Not on file  Other Topics Concern  . Not on file  Social History Narrative  . Not on  file   Family History: Family History  Problem Relation Age of Onset  . Heart disease Maternal Grandmother   . Stomach cancer Maternal Aunt    Allergies: Allergies  Allergen Reactions  . Pravastatin Other (See Comments)    Elevated CK  . Lexapro  [Escitalopram Oxalate]   . Rosuvastatin Calcium   . Doxycycline Nausea Only  . Lisinopril Cough  . Olanzapine Swelling  . Simvastatin Other (See Comments)    Per pt it causes muscle pain  . Voltaren [Diclofenac Sodium] Other (See Comments)  . Zoloft [Sertraline Hcl]    Medications: See med rec.  Review of Systems: No fevers, chills, night sweats, weight loss, chest pain, or shortness of breath.   Objective:    General: Well Developed, well nourished, and in no acute distress.  Neuro: Alert and oriented x3, extra-ocular muscles intact, sensation grossly intact.  HEENT: Normocephalic, atraumatic, pupils equal round reactive to light, neck supple, no masses, no lymphadenopathy, thyroid nonpalpable.  Skin: Warm and dry, no rashes. Cardiac: Regular rate and rhythm, no murmurs rubs or gallops, no lower extremity edema.  Respiratory: Clear to auscultation bilaterally. Not using accessory muscles, speaking in full sentences.  Impression and Recommendations:    Lumbar spinal stenosis Multilevel fusion,  persistent back pain, we did bilateral SI joint injections approximately 6 weeks ago without a tremendous amount of improvement, she did have good pain relief on the day of the injection. Continues to have axial back pain worse with standing, better with leaning forward consistent with spinal stenosis. I would like flexion-extension lumbar spine x-rays to evaluate for fusion construct stability. Adding 5 days of prednisone, hydrocodone, referral back to Dr. Andrey CampanileWilson.  Primary osteoarthritis of left knee Orthovisc No. 4 of 4 into the left knee was performed back in April, knees are actually doing okay.    ___________________________________________ Ihor Austinhomas J. Benjamin Stainhekkekandam, M.D., ABFM., CAQSM. Primary Care and Sports Medicine [facility]  Adjunct Professor of Family Medicine  [employer]

## 2018-08-29 NOTE — Assessment & Plan Note (Signed)
Multilevel fusion, persistent back pain, we did bilateral SI joint injections approximately 6 weeks ago without a tremendous amount of improvement, she did have good pain relief on the day of the injection. Continues to have axial back pain worse with standing, better with leaning forward consistent with spinal stenosis. I would like flexion-extension lumbar spine x-rays to evaluate for fusion construct stability. Adding 5 days of prednisone, hydrocodone, referral back to Dr. Redmond Pulling.

## 2018-09-05 ENCOUNTER — Encounter: Payer: Self-pay | Admitting: Obstetrics & Gynecology

## 2018-09-05 ENCOUNTER — Other Ambulatory Visit (HOSPITAL_COMMUNITY)
Admission: RE | Admit: 2018-09-05 | Discharge: 2018-09-05 | Disposition: A | Discharge status: Home / Self Care | Payer: Medicare HMO | Source: Ambulatory Visit | Attending: Obstetrics & Gynecology | Admitting: Obstetrics & Gynecology

## 2018-09-05 ENCOUNTER — Other Ambulatory Visit: Payer: Self-pay

## 2018-09-05 ENCOUNTER — Ambulatory Visit (INDEPENDENT_AMBULATORY_CARE_PROVIDER_SITE_OTHER): Payer: Medicare HMO | Admitting: Obstetrics & Gynecology

## 2018-09-05 DIAGNOSIS — Z01419 Encounter for gynecological examination (general) (routine) without abnormal findings: Secondary | ICD-10-CM | POA: Insufficient documentation

## 2018-09-05 DIAGNOSIS — Z1151 Encounter for screening for human papillomavirus (HPV): Secondary | ICD-10-CM | POA: Diagnosis not present

## 2018-09-05 DIAGNOSIS — Z87410 Personal history of cervical dysplasia: Secondary | ICD-10-CM | POA: Diagnosis not present

## 2018-09-05 DIAGNOSIS — Z780 Asymptomatic menopausal state: Secondary | ICD-10-CM | POA: Insufficient documentation

## 2018-09-05 NOTE — Progress Notes (Signed)
   Subjective:    Patient ID: Karina Brown, female    DOB: 07-01-1950, 68 y.o.   MRN: [id number]  HPI  Pt has history of cervical dysplasia in her mid 51s.  Last pap here was normal.  Mammogram is up to date.  Pt not sexually active.  No vaginal complaints  Review of Systems  Constitutional: Negative.   Respiratory: Negative.   Cardiovascular: Negative.   Genitourinary: Positive for vaginal pain. Negative for dysuria, pelvic pain and vaginal bleeding.  Musculoskeletal: Positive for back pain.  Psychiatric/Behavioral: Negative.        Objective:   Physical Exam Vitals signs reviewed.  Constitutional:      General: She is not in acute distress.    Appearance: She is well-developed.  HENT:     Head: Normocephalic and atraumatic.  Eyes:     Conjunctiva/sclera: Conjunctivae normal.  Cardiovascular:     Rate and Rhythm: Normal rate and regular rhythm.  Pulmonary:     Effort: Pulmonary effort is normal.     Breath sounds: Normal breath sounds.  Genitourinary:    Comments: Sebaceous cysts on vulva Nml vagina and cervix Bimanul nml and limited by habitus Skin:    General: Skin is warm and dry.  Neurological:     Mental Status: She is alert and oriented to person, place, and time.  Psychiatric:        Mood and Affect: Mood normal.    Assessment & Plan:  68 year old female presents for history of cervical dysplasia.  We do not have copies of her Pap smears.  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  Her Pap smear in 2018 was negative.  We will doing 1 more Pap smear today.  If cotesting is negative patient can be released for screening.  She is over age 89.  Mammograms are still indicated yearly for Ms. Owens Shark.  Patient has a primary care doctor who watches all of her other healthcare maintenance issues.  Patient understands that postmenopausal bleeding is not normal and she should contact us if she ever has vaginal  bleeding.  15 minutes spent face-to-face with patient with greater than 50% counseling.

## 2018-09-07 LAB — CYTOLOGY - PAP
Diagnosis: NEGATIVE
HPV: NOT DETECTED

## 2018-11-14 ENCOUNTER — Ambulatory Visit: Payer: Medicare HMO | Admitting: Sports Medicine

## 2018-11-15 ENCOUNTER — Other Ambulatory Visit: Payer: Self-pay

## 2018-11-15 ENCOUNTER — Encounter: Payer: Self-pay | Admitting: Sports Medicine

## 2018-11-15 ENCOUNTER — Ambulatory Visit (INDEPENDENT_AMBULATORY_CARE_PROVIDER_SITE_OTHER): Payer: Medicare HMO | Admitting: Sports Medicine

## 2018-11-15 DIAGNOSIS — M1712 Unilateral primary osteoarthritis, left knee: Secondary | ICD-10-CM | POA: Diagnosis not present

## 2018-11-15 NOTE — Assessment & Plan Note (Signed)
Recurrence of pain, repeat steroid injection today, she will return in 3 weeks for PRP injection #1 and then 1 week later for PRP injection #2. We discussed the limitations of PRP, she did have a failure of Orthovisc, and she continues to desire to avoid knee arthroplasty. We have also tried to get her genicular radiofrequency ablation, they could not get it approved.

## 2018-11-15 NOTE — Progress Notes (Signed)
Subjective:    CC: Left Knee Pain  HPI: Zakeria is a pleasant 68 yo with a history significant for L knee osteoarthritis who completed an Orthovisc treatment in April 2020. Since her last check in July her pain has returned and limited her ability to ambulate over distances. She is here to discuss possible treatment options prior to making her decision on knee replacement.   I reviewed the past medical history, family history, social history, surgical history, and allergies today and no changes were needed.  Please see the problem list section below in epic for further details.  Past Medical History: Past Medical History:  Diagnosis Date  . Lumbar spinal stenosis 12/24/2015  . Primary osteoarthritis of left knee 12/24/2015   Disc with x-rays of the back and knees return to patient   Past Surgical History: Past Surgical History:  Procedure Laterality Date  . breast cyst removal    . BREAST REDUCTION SURGERY    . KNEE ARTHROSCOPY    . SPINE SURGERY     Social History: Social History   Socioeconomic History  . Marital status: Widowed    Spouse name: Not on file  . Number of children: Not on file  . Years of education: Not on file  . Highest education level: Not on file  Occupational History  . Occupation: retired  Scientific laboratory technician  . Financial resource strain: Not on file  . Food insecurity    Worry: Not on file    Inability: Not on file  . Transportation needs    Medical: Not on file    Non-medical: Not on file  Tobacco Use  . Smoking status: Never Smoker  . Smokeless tobacco: Never Used  Substance and Sexual Activity  . Alcohol use: No  . Drug use: No  . Sexual activity: Never  Lifestyle  . Physical activity    Days per week: Not on file    Minutes per session: Not on file  . Stress: Not on file  Relationships  . Social Herbalist on phone: Not on file    Gets together: Not on file    Attends religious service: Not on file    Active member of club or  organization: Not on file    Attends meetings of clubs or organizations: Not on file    Relationship status: Not on file  Other Topics Concern  . Not on file  Social History Narrative  . Not on file   Family History: Family History  Problem Relation Age of Onset  . Heart disease Maternal Grandmother   . Stomach cancer Maternal Aunt    Allergies: Allergies  Allergen Reactions  . Pravastatin Other (See Comments)    Elevated CK  . Lexapro  [Escitalopram Oxalate]   . Rosuvastatin Calcium   . Doxycycline Nausea Only  . Lisinopril Cough  . Olanzapine Swelling  . Simvastatin Other (See Comments)    Per pt it causes muscle pain  . Voltaren [Diclofenac Sodium] Other (See Comments)  . Zoloft [Sertraline Hcl]    Medications: See med rec.  Review of Systems: No fevers, chills, night sweats, weight loss, chest pain, or shortness of breath.   Objective:    General: Well Developed, well nourished, and in no acute distress.  Neuro: Alert and oriented x3, extra-ocular muscles intact, sensation grossly intact.  HEENT: Normocephalic, atraumatic, pupils equal round reactive to light. Skin: Warm and dry, no rashes. Cardiac: Regular rate and rhythm, no lower extremity edema.  Respiratory: Not using accessory muscles, speaking in full sentences.  Knee: Knee enlarged on inspection with possible effusion. ROM limited in flexion and full in extension and lower leg rotation.. Non painful patellar compression. Patellar glide without crepitus. Patellar and quadriceps tendons unremarkable.  A/P: Due to her short relief of pain following her Orthovisc treatment her return of pain is likely due to an aggravation of existing knee pathology. She was advised on options of PRP therapy and ultimate need for knee replacement. Today she was given knee steroid injection which responded well with rapid relief of pain.    Impression and Recommendations:    Primary osteoarthritis of left knee Recurrence  of pain, repeat steroid injection today, she will return in 3 weeks for PRP injection #1 and then 1 week later for PRP injection #2. We discussed the limitations of PRP, she did have a failure of Orthovisc, and she continues to desire to avoid knee arthroplasty. We have also tried to get her genicular radiofrequency ablation, they could not get it approved.   ___________________________________________ Ihor Austin. Benjamin Stain, M.D., ABFM., CAQSM. Primary Care and Sports Medicine [facility]  Adjunct Professor of Family Medicine  [employer]

## 2018-12-06 ENCOUNTER — Encounter: Payer: Self-pay | Admitting: Sports Medicine

## 2018-12-06 ENCOUNTER — Ambulatory Visit (INDEPENDENT_AMBULATORY_CARE_PROVIDER_SITE_OTHER): Payer: Self-pay | Admitting: Sports Medicine

## 2018-12-06 ENCOUNTER — Other Ambulatory Visit: Payer: Self-pay

## 2018-12-06 DIAGNOSIS — M1712 Unilateral primary osteoarthritis, left knee: Secondary | ICD-10-CM

## 2018-12-06 NOTE — Patient Instructions (Signed)
 Platelet-rich plasma is used in musculoskeletal medicine to focus your own body's ability to heal. It has several well-done published randomized control trials (RCT) which demonstrate both its effectiveness and safety in many musculoskeletal conditions, including osteoarthritis, tendinopathies, and damaged vertebral discs. PRP has been in clinical use since the 1990's. Many people know that platelets form a clot if there is a cut in the skin. It turns out that platelets do not only form a clot, they also start the body's own repair process. When platelets activate to form a clot, they also release alpha granules which have hundreds of chemical messengers in them that initiate and organize repair to the damaged tissue. Precisely placing PRP into the site of injury will initiate the healing process by activating on the damaged cartilage or tendon. This is an inflammatory process, and inflammation is the vital first phase of Healing.  What to expect and how to prepare for PRP  . 2 weeks prior to the procedure: depending on the procedure, you may need to arrange for a driver to bring you home. IF you are having a lower extremity procedure, we can provide crutches as needed.  . 7 days prior to the procedure: Stop taking anti-inflammatory drugs like ibuprofen, Naprosyn, Celebrex, or Meloxicam. Let your doctor know if you have been taking prednisone or other corticosteroids in the last month.  . The day before the procedure: thoroughly shower and clean your skin.   . The day of the procedure: Wear loose-fitting clothing like sweatpants or shorts. If you are having an upper body procedure wear a top that can button or zip up.  PRP will initiate healing and a productive inflammation, and PRP therapy will make the body part treated sore for 4 days to two weeks. Anti-inflammatory drugs (i.e. ibuprofen, Naprosyn, Celebrex) and corticosteroids such as prednisone can blunt or stop this process, so  it is important to not take any anti-inflammatory drugs for 7 days before getting PRP therapy, or for at least three weeks after PRP therapy. Corticosteroid injections can blunt inflammation for 30 days, so let us know if you have had one recently. Depending on the body part injected, you may be in a sling or on crutches for several days. Just like wringing out a wet dishcloth, if you load or tense a tendon or ligament that has just been injected with PRP, some of the PRP injected will squish out. By keeping the body part treated relaxed by using a sling (for the shoulder or arm) or crutches (for hips and legs) for a few days, the PRP can bind in place and do its job.   You may need a driver to bring you home.  Tobacco?nicotine is a potent toxin and its use constricts small blood vessels which are needed for tissue repair.  Tobacco/nicotine use will limit the effectiveness of any treatment and stopping tobacco use is one of the single  greatest actions you can take to improve your health. Avoid toxins like alcohol, which inhibits and depresses the cells needed for tissue repair.  What happens during the PRP procedure?  Platelet rich plasma is made by taking some of your blood and performing a two-stage centrifuge process on it to concentrate the PRP. First, your blood is drawn into a syringe with a small amount of anti-coagulant in it (this is to keep the blood from clotting during this process). The amount of blood drawn is usually about 30-60 milliliters, depending on how much PRP is needed for   the treatment.  (There are 355 milliliters in a 12-ounce soda can for comparison).  Then the blood is transferred in a sterile fashion into a centrifuge tube. It is then centrifuged for the first cycle where the red blood cells are isolated and discarded. In the second centrifuge cycle, the platelet-rich fraction of the remaining plasma is concentrated and placed in a syringe. The skin at the  injection site is numbed with a small amount of topical cooling spray. Dr Farouk Vivero will then precisely inject the PRP into the injury site using ultrasound guidance.  What to do after your procedure  I will give you specific medicine to control any discomfort you may have after the procedure. Avoid NSAIDs like ibuprofen. Acetaminophen can be used for mild pain.  Depending on the part of the body treated, usually you will be placed in a sling or on crutches for 1 to 3 days. Do your best not to tense or load the treated area during this time. After 3 days, unless otherwise instructed, the treated body part should be used and slowly moved through its full range of motion. It will be sore, but you will not be doing damage by moving it, in fact it needs to move to heal. If you were on crutches for a period of time, walking is ok once you are off the crutches. For now, avoid activities that specifically hurt you before being treated. Exercise is vital to good health and finding a way to cross train around your injury is important not only for your physical health, but for your mental health as well. Ask me about cross training options for your injury. Some brief (10 minutes or less) period of heat or ice therapy will not hurt the therapy, but it is not required. Usually, depending on the initial injury, physical therapy is started from two weeks to four weeks after injection. Improvements in pain and function should be expected from 8 weeks to 12 weeks after injection and some injuries may require more than one treatment.    ___________________________________________ Kalisha Keadle J. Timmia Cogburn, M.D., ABFM., CAQSM. Primary Care and Sports Medicine [facility]  Adjunct Professor of Family Medicine  [employer]   

## 2018-12-06 NOTE — Assessment & Plan Note (Signed)
At this point she has failed Orthovisc, steroid injections, PRP injection #1 2 today, return in 1 week for #2 of 2. She does understand the limitations in the literature of PRP intra-articular. No NSAIDs. Return in 1 week, we can consider chronic medical pain management should this not work. Has declined knee arthroplasty in the past.

## 2018-12-06 NOTE — Progress Notes (Signed)
   Procedure: Real-time Ultrasound Guided Platelet Rich Plasma (PRP) Injection of left knee Device: GE Logiq E  Verbal informed consent obtained.  Time-out conducted.  Noted no overlying erythema, induration, or other signs of local infection.  Obtained 30 cc of blood from peripheral vein, using the "PEAK" centrifuge, red blood cells were separated from the plasma. Subsequently red blood cells were drained leaving only plasma with the buffy coat layer between the desired lines. Platelet poor plasma was then centrifuged out, and remaining platelet rich plasma aspirated into a 5 cc syringe.  Skin prepped in a sterile fashion.  Local anesthesia: Topical Ethyl chloride.  With sterile technique and under real time ultrasound guidance the platelet rich plasma (PRP) obtained above: The entire syringe of PRP injected into the suprapatellar recess. Completed without difficulty  Advised to call if fevers/chills, erythema, induration, drainage, or persistent bleeding.  Images permanently stored and available for review in the ultrasound unit.  Impression: Technically successful ultrasound guided Platelet Rich Plasma (PRP) injection.  ________________________________________________________________________________________  Primary osteoarthritis of left knee At this point she has failed Orthovisc, steroid injections, PRP injection #1 2 today, return in 1 week for #2 of 2. She does understand the limitations in the literature of PRP intra-articular. No NSAIDs. Return in 1 week, we can consider chronic medical pain management should this not work. Has declined knee arthroplasty in the past.    ___________________________________________ Gwen Her. Dianah Field, M.D., ABFM., CAQSM. Primary Care and Sports Medicine [facility]  Adjunct Professor of [place] [employer]

## 2018-12-14 ENCOUNTER — Other Ambulatory Visit: Payer: Self-pay

## 2018-12-14 ENCOUNTER — Ambulatory Visit (INDEPENDENT_AMBULATORY_CARE_PROVIDER_SITE_OTHER): Payer: Self-pay | Admitting: Sports Medicine

## 2018-12-14 ENCOUNTER — Encounter: Payer: Self-pay | Admitting: Sports Medicine

## 2018-12-14 DIAGNOSIS — M1712 Unilateral primary osteoarthritis, left knee: Secondary | ICD-10-CM

## 2018-12-14 NOTE — Progress Notes (Signed)
   Procedure: Real-time Ultrasound Guided Platelet Rich Plasma (PRP) Injection of left knee Device: GE Logiq E  Verbal informed consent obtained.  Time-out conducted.  Noted no overlying erythema, induration, or other signs of local infection.  Obtained 30 cc of blood from peripheral vein, using the "PEAK" centrifuge, red blood cells were separated from the plasma. Subsequently red blood cells were drained leaving only plasma with the buffy coat layer between the desired lines. Platelet poor plasma was then centrifuged out, and remaining platelet rich plasma aspirated into a 5 cc syringe.  Skin prepped in a sterile fashion.  Local anesthesia: Topical Ethyl chloride.  With sterile technique and under real time ultrasound guidance the platelet rich plasma (PRP) obtained above: The entire syringe of PRP injected into the suprapatellar recess. Completed without difficulty  Advised to call if fevers/chills, erythema, induration, drainage, or persistent bleeding.  Images permanently stored and available for review in the ultrasound unit.  Impression: Technically successful ultrasound guided Platelet Rich Plasma (PRP) injection.  Primary osteoarthritis of left knee Has failed Orthovisc, steroid injections, PRP injection #2 of 2 into the left knee today. She still has significant pain, she also understands the limitations in the literature of PRP used intra-articular. She will continue to avoid NSAIDs and we can consider referral for chronic medical pain management should this not be effective. She does continue to decline any discussion of knee arthroplasty.

## 2018-12-14 NOTE — Assessment & Plan Note (Signed)
Has failed Orthovisc, steroid injections, PRP injection #2 of 2 into the left knee today. She still has significant pain, she also understands the limitations in the literature of PRP used intra-articular. She will continue to avoid NSAIDs and we can consider referral for chronic medical pain management should this not be effective. She does continue to decline any discussion of knee arthroplasty.

## 2019-01-02 ENCOUNTER — Ambulatory Visit (HOSPITAL_COMMUNITY): Payer: Medicare HMO | Admitting: Psychiatry

## 2019-01-13 ENCOUNTER — Ambulatory Visit: Payer: Medicare HMO | Admitting: Sports Medicine

## 2019-02-06 ENCOUNTER — Encounter (HOSPITAL_COMMUNITY): Payer: Self-pay | Admitting: Psychiatry

## 2019-02-06 ENCOUNTER — Ambulatory Visit (INDEPENDENT_AMBULATORY_CARE_PROVIDER_SITE_OTHER): Payer: Medicare HMO | Admitting: Psychiatry

## 2019-02-06 DIAGNOSIS — F4321 Adjustment disorder with depressed mood: Secondary | ICD-10-CM | POA: Diagnosis not present

## 2019-02-06 DIAGNOSIS — F329 Major depressive disorder, single episode, unspecified: Secondary | ICD-10-CM | POA: Diagnosis not present

## 2019-02-06 DIAGNOSIS — F411 Generalized anxiety disorder: Secondary | ICD-10-CM | POA: Diagnosis not present

## 2019-02-06 MED ORDER — BUPROPION HCL ER (XL) 150 MG PO TB24: 150.0000 mg | ORAL_TABLET | Freq: Every day | ORAL | 0 refills | Status: DC

## 2019-02-06 NOTE — Progress Notes (Signed)
Psychiatric Initial Adult Assessment   Patient Identification: Karina Brown MRN:  [id number] Date of Evaluation:  02/06/2019 Referral Source: primary care  Chief Complaint:  depression Visit Diagnosis:    ICD-10-CM   1. GAD (generalized anxiety disorder)  F41.1   2. Major depressive disorder with single episode, remission status unspecified  F32.9 buPROPion (WELLBUTRIN XL) 150 MG 24 hr tablet  3. Grief  F43.21    I connected with Karina Brown on 02/06/19 at  2:00 PM EST by a video enabled telemedicine application and verified that I am speaking with the correct person using two identifiers.   I discussed the limitations of evaluation and management by telemedicine and the availability of in person appointments. The patient expressed understanding and agreed to proceed. History of Present Illness: Patient is a 68 years old currently widowed African-American female referred by primary care physician for management of depression  She has been diagnosed with Covid in September and her mom is also sick with Covid she died of aspiration pneumonia.  Patient states she has suffered from depression for 40 years she has had a difficult first marriage started at age 5 she has suffered from depression.  She has been on Lamictal in different medication but currently she is on Lamictal and Effexor Since this August she has been feeling more down tired decreased energy withdrawn disturbed sleep goes to bed late wakes up late she worries worries excessively about her health about her mom's dad she is still in grief  She does not endorse psychotic symptoms or manic symptoms She lives by herself she does have supportive kids who are grown  She feels medication needs to be adjusted but in general she has stopped her Wellbutrin early part of the year she feels maybe that medication needs to be started again  Modifying factors; her daughter, SSI Aggravating factors; multiple medical conditions, widow, back  pain. lonliness Duration 50 plu syears      Past Psychiatric History: depression  Previous Psychotropic Medications: Yes   Substance Abuse History in the last 12 months:  No.  Consequences of Substance Abuse: NA  Past Medical History:  Past Medical History:  Diagnosis Date  . Lumbar spinal stenosis 12/24/2015  . Primary osteoarthritis of left knee 12/24/2015   Disc with x-rays of the back and knees return to patient    Past Surgical History:  Procedure Laterality Date  . breast cyst removal    . BREAST REDUCTION SURGERY    . KNEE ARTHROSCOPY    . SPINE SURGERY      Family Psychiatric History: mom anxiety  Family History:  Family History  Problem Relation Age of Onset  . Heart disease Maternal Grandmother   . Stomach cancer Maternal Aunt     Social History:   Social History   Socioeconomic History  . Marital status: Widowed    Spouse name: Not on file  . Number of children: Not on file  . Years of education: Not on file  . Highest education level: Not on file  Occupational History  . Occupation: retired  Tobacco Use  . Smoking status: Never Smoker  . Smokeless tobacco: Never Used  Substance and Sexual Activity  . Alcohol use: No  . Drug use: No  . Sexual activity: Never  Other Topics Concern  . Not on file  Social History Narrative  . Not on file   Social Determinants of Health   Financial Resource Strain:   . Difficulty of Paying Living Expenses: Not  on file  Food Insecurity:   . Worried About Programme researcher, broadcasting/film/videounning Out of Food in the Last Year: Not on file  . Ran Out of Food in the Last Year: Not on file  Transportation Needs:   . Lack of Transportation (Medical): Not on file  . Lack of Transportation (Non-Medical): Not on file  Physical Activity:   . Days of Exercise per Week: Not on file  . Minutes of Exercise per Session: Not on file  Stress:   . Feeling of Stress : Not on file  Social Connections:   . Frequency of Communication with Friends and  Family: Not on file  . Frequency of Social Gatherings with Friends and Family: Not on file  . Attends Religious Services: Not on file  . Active Member of Clubs or Organizations: Not on file  . Attends BankerClub or Organization Meetings: Not on file  . Marital Status: Not on file    Additional Social History: grew up with parents. Somewhat rough . No abuse, married age 68, suffered depression as marriage was difficult She has now 4 grown kids.  Currently on SSI  Allergies:   Allergies  Allergen Reactions  . Pravastatin Other (See Comments)    Elevated CK  . Lexapro  [Escitalopram Oxalate]   . Rosuvastatin Calcium   . Doxycycline Nausea Only  . Lisinopril Cough  . Olanzapine Swelling  . Simvastatin Other (See Comments)    Per pt it causes muscle pain  . Voltaren [Diclofenac Sodium] Other (See Comments)  . Zoloft [Sertraline Hcl]     Metabolic Disorder Labs: No results found for: HGBA1C, MPG No results found for: PROLACTIN No results found for: CHOL, TRIG, HDL, CHOLHDL, VLDL, LDLCALC No results found for: TSH  Therapeutic Level Labs: No results found for: LITHIUM No results found for: CBMZ No results found for: VALPROATE  Current Medications: Current Outpatient Medications  Medication Sig Dispense Refill  . amLODipine (NORVASC) 5 MG tablet TAKE 1 TABLET BY MOUTH EVERY DAY    . buPROPion (WELLBUTRIN XL) 150 MG 24 hr tablet Take 1 tablet (150 mg total) by mouth daily. 30 tablet 0  . Cholecalciferol (VITAMIN D) 2000 units CAPS Take by mouth.    . Cyanocobalamin (B-12) 2000 MCG TABS Take by mouth.    . hydrochlorothiazide (HYDRODIURIL) 12.5 MG tablet Take 12.5 mg by mouth daily.    Marland Kitchen. lamoTRIgine (LAMICTAL) 100 MG tablet Take by mouth.    . metFORMIN (GLUCOPHAGE-XR) 500 MG 24 hr tablet TAKE 1 TABLET (500 MG TOTAL) BY MOUTH DAILY WITH BREAKFAST    . potassium chloride (K-DUR) 10 MEQ tablet Take by mouth.    . venlafaxine XR (EFFEXOR-XR) 75 MG 24 hr capsule TAKE 3 CAPSULES BY MOUTH  EVERY DAY     No current facility-administered medications for this visit.      Psychiatric Specialty Exam: Review of Systems  Constitutional: Positive for fatigue.  Psychiatric/Behavioral: Negative for agitation.    There were no vitals taken for this visit.There is no height or weight on file to calculate BMI.  General Appearance: Casual  Eye Contact:  Fair  Speech:  Slow  Volume:  Decreased  Mood:  subdued  Affect:  Congruent  Thought Process:  Goal Directed  Orientation:  Full (Time, Place, and Person)  Thought Content:  Rumination  Suicidal Thoughts:  No  Homicidal Thoughts:  No  Memory:  Immediate;   Fair Recent;   Fair  Judgement:  Fair  Insight:  Shallow  Psychomotor Activity:  Decreased  Concentration:  Concentration: Fair and Attention Span: Fair  Recall:  [employer] of Knowledge:Fair  Language: Fair  Akathisia:  No  Handed:  Right  AIMS (if indicated):  not done  Assets:  Desire for Improvement  ADL's:  Intact  Cognition: WNL  Sleep:  poor to variable   Screenings:   Assessment and Plan: as follows  MDD moderate recurrent; possible triggers, grief, multiple medical conditions Continue lamictal, effexor, add wellbutrin 150mg  Has refills of lamictal, effexor from [facility] Will send wellbutrin 150mg , increase by next visit if tolerates , call back for concerns GAD: continue effexor, refer to therapy to work on grief and anxiety Grief; refer to therapy, continue above meds Insomnia: reviewed sleep hygiene, avoid waking up late or taking naps Distraction from worries     I discussed the assessment and treatment plan with the patient. The patient was provided an opportunity to ask questions and all were answered. The patient agreed with the plan and demonstrated an understanding of the instructions.   The patient was advised to call back or seek an in-person evaluation if the symptoms worsen or if the condition fails to improve as anticipated.  I  provided 50 minutes of non-face-to-face time during this encounter.   Merian Capron, MD 12/28/20202:34 PM

## 2019-03-02 ENCOUNTER — Ambulatory Visit (HOSPITAL_COMMUNITY): Payer: Medicare HMO | Admitting: Psychiatry

## 2019-03-28 ENCOUNTER — Ambulatory Visit (INDEPENDENT_AMBULATORY_CARE_PROVIDER_SITE_OTHER): Payer: Medicare HMO | Admitting: Psychiatry

## 2019-03-28 ENCOUNTER — Other Ambulatory Visit: Payer: Self-pay

## 2019-03-28 ENCOUNTER — Encounter (HOSPITAL_COMMUNITY): Payer: Self-pay | Admitting: Psychiatry

## 2019-03-28 DIAGNOSIS — F4321 Adjustment disorder with depressed mood: Secondary | ICD-10-CM | POA: Diagnosis not present

## 2019-03-28 DIAGNOSIS — F411 Generalized anxiety disorder: Secondary | ICD-10-CM

## 2019-03-28 DIAGNOSIS — F331 Major depressive disorder, recurrent, moderate: Secondary | ICD-10-CM | POA: Diagnosis not present

## 2019-03-28 DIAGNOSIS — F329 Major depressive disorder, single episode, unspecified: Secondary | ICD-10-CM

## 2019-03-28 NOTE — Progress Notes (Signed)
[facility] Follow up visit  Patient Identification: Karina Brown MRN:  [id number] Date of Evaluation:  03/28/2019 Referral Source: primary care  Chief Complaint:  depression Visit Diagnosis:    ICD-10-CM   1. Major depressive disorder with single episode, remission status unspecified  F32.9   2. GAD (generalized anxiety disorder)  F41.1   3. Grief  F43.21    .I connected with Joanna Borawski on 03/28/19 at  2:00 PM EST by a video enabled telemedicine application and verified that I am speaking with the correct person using two identifiers.   I discussed the limitations of evaluation and management by telemedicine and the availability of in person appointments. The patient expressed understanding and agreed to proceed. History of Present Illness: Patient is a 69 years old currently widowed African-American female referred by primary care physician for management of depression  She has been diagnosed with Covid in September and her mom is also sick with Covid she died of aspiration pneumonia.  Patient states she has suffered from depression for 40 years she has had a difficult first marriage started at age 55 she has suffered from depression.  She has been on Lamictal in different medication but currently she is on Lamictal and Effexor  Last visit wellbutrin was restarted some better, still going thru grief of loosing her mom Did not started therapy says already too many bills On effexor for a long time, pcp fills up meds including lamictal. No rash   Modifying factors; her daughter, SSI Aggravating factors; multiple medical conditions, widow, back pain. lonliness Duration 50 plu syears      Past Psychiatric History: depression  Previous Psychotropic Medications: Yes   Substance Abuse History in the last 12 months:  No.  Consequences of Substance Abuse: NA  Past Medical History:  Past Medical History:  Diagnosis Date  . Lumbar spinal stenosis 12/24/2015  . Primary osteoarthritis of  left knee 12/24/2015   Disc with x-rays of the back and knees return to patient    Past Surgical History:  Procedure Laterality Date  . breast cyst removal    . BREAST REDUCTION SURGERY    . KNEE ARTHROSCOPY    . SPINE SURGERY      Family Psychiatric History: mom anxiety  Family History:  Family History  Problem Relation Age of Onset  . Heart disease Maternal Grandmother   . Stomach cancer Maternal Aunt     Social History:   Social History   Socioeconomic History  . Marital status: Widowed    Spouse name: Not on file  . Number of children: Not on file  . Years of education: Not on file  . Highest education level: Not on file  Occupational History  . Occupation: retired  Tobacco Use  . Smoking status: Never Smoker  . Smokeless tobacco: Never Used  Substance and Sexual Activity  . Alcohol use: No  . Drug use: No  . Sexual activity: Never  Other Topics Concern  . Not on file  Social History Narrative  . Not on file   Social Determinants of Health   Financial Resource Strain:   . Difficulty of Paying Living Expenses: Not on file  Food Insecurity:   . Worried About Programme researcher, broadcasting/film/video in the Last Year: Not on file  . Ran Out of Food in the Last Year: Not on file  Transportation Needs:   . Lack of Transportation (Medical): Not on file  . Lack of Transportation (Non-Medical): Not on file  Physical Activity:   .  Days of Exercise per Week: Not on file  . Minutes of Exercise per Session: Not on file  Stress:   . Feeling of Stress : Not on file  Social Connections:   . Frequency of Communication with Friends and Family: Not on file  . Frequency of Social Gatherings with Friends and Family: Not on file  . Attends Religious Services: Not on file  . Active Member of Clubs or Organizations: Not on file  . Attends Banker Meetings: Not on file  . Marital Status: Not on file     Allergies:   Allergies  Allergen Reactions  . Pravastatin Other (See  Comments)    Elevated CK  . Lexapro  [Escitalopram Oxalate]   . Rosuvastatin Calcium   . Doxycycline Nausea Only  . Lisinopril Cough  . Olanzapine Swelling  . Simvastatin Other (See Comments)    Per pt it causes muscle pain  . Voltaren [Diclofenac Sodium] Other (See Comments)  . Zoloft [Sertraline Hcl]     Metabolic Disorder Labs: No results found for: HGBA1C, MPG No results found for: PROLACTIN No results found for: CHOL, TRIG, HDL, CHOLHDL, VLDL, LDLCALC No results found for: TSH  Therapeutic Level Labs: No results found for: LITHIUM No results found for: CBMZ No results found for: VALPROATE  Current Medications: Current Outpatient Medications  Medication Sig Dispense Refill  . amLODipine (NORVASC) 5 MG tablet TAKE 1 TABLET BY MOUTH EVERY DAY    . buPROPion (WELLBUTRIN XL) 150 MG 24 hr tablet Take 1 tablet (150 mg total) by mouth daily. 30 tablet 0  . Cholecalciferol (VITAMIN D) 2000 units CAPS Take by mouth.    . Cyanocobalamin (B-12) 2000 MCG TABS Take by mouth.    . hydrochlorothiazide (HYDRODIURIL) 12.5 MG tablet Take 12.5 mg by mouth daily.    Marland Kitchen lamoTRIgine (LAMICTAL) 100 MG tablet Take by mouth.    . metFORMIN (GLUCOPHAGE-XR) 500 MG 24 hr tablet TAKE 1 TABLET (500 MG TOTAL) BY MOUTH DAILY WITH BREAKFAST    . potassium chloride (K-DUR) 10 MEQ tablet Take by mouth.    . venlafaxine XR (EFFEXOR-XR) 75 MG 24 hr capsule TAKE 3 CAPSULES BY MOUTH EVERY DAY     No current facility-administered medications for this visit.      Psychiatric Specialty Exam: Review of Systems  Psychiatric/Behavioral: Negative for agitation.    There were no vitals taken for this visit.There is no height or weight on file to calculate BMI.  General Appearance: Casual  Eye Contact:  Fair  Speech:  Slow  Volume:  Decreased  Mood: some better  Affect:  Congruent  Thought Process:  Goal Directed  Orientation:  Full (Time, Place, and Person)  Thought Content:  Rumination  Suicidal  Thoughts:  No  Homicidal Thoughts:  No  Memory:  Immediate;   Fair Recent;   Fair  Judgement:  Fair  Insight:  Shallow  Psychomotor Activity:  Decreased  Concentration:  Concentration: Fair and Attention Span: Fair  Recall:  [employer] of Knowledge:Fair  Language: Fair  Akathisia:  No  Handed:  Right  AIMS (if indicated):  not done  Assets:  Desire for Improvement  ADL's:  Intact  Cognition: WNL  Sleep:  poor to variable   Screenings:   Assessment and Plan: as follows  MDD moderate recurrent; possible triggers, grief, multiple medical conditions Some better, trying to do more at home, continue wellbutrin, lamictal. effexor Has refills of lamictal, effexor [facility]  GAD: continue  effexor, refer to therapy to work on grief and anxiety but says too many bills. Provided supportive therapy Grief; some better, continue meds Insomnia: reviewed sleep hygiene, avoid waking up late or taking naps Distraction from worries   Call for refills   I discussed the assessment and treatment plan with the patient. The patient was provided an opportunity to ask questions and all were answered. The patient agreed with the plan and demonstrated an understanding of the instructions.   The patient was advised to call back or seek an in-person evaluation if the symptoms worsen or if the condition fails to improve as anticipated.  Fu 38m  Merian Capron, MD 2/16/20212:12 PM

## 2019-06-19 ENCOUNTER — Ambulatory Visit (HOSPITAL_COMMUNITY): Payer: Medicare HMO | Admitting: Psychiatry

## 2019-06-29 ENCOUNTER — Telehealth (INDEPENDENT_AMBULATORY_CARE_PROVIDER_SITE_OTHER): Payer: Medicare HMO | Admitting: Psychiatry

## 2019-06-29 ENCOUNTER — Encounter (HOSPITAL_COMMUNITY): Payer: Self-pay | Admitting: Psychiatry

## 2019-06-29 DIAGNOSIS — F4321 Adjustment disorder with depressed mood: Secondary | ICD-10-CM | POA: Diagnosis not present

## 2019-06-29 DIAGNOSIS — F411 Generalized anxiety disorder: Secondary | ICD-10-CM | POA: Diagnosis not present

## 2019-06-29 DIAGNOSIS — F329 Major depressive disorder, single episode, unspecified: Secondary | ICD-10-CM | POA: Diagnosis not present

## 2019-06-29 MED ORDER — BUPROPION HCL ER (XL) 150 MG PO TB24: 150.0000 mg | ORAL_TABLET | Freq: Every day | ORAL | 0 refills | Status: DC

## 2019-06-29 NOTE — Progress Notes (Signed)
[facility] Follow up visit  Patient Identification: Karina Brown MRN:  [id number] Date of Evaluation:  06/29/2019 Referral Source: primary care  Chief Complaint:  Depression follow up  Visit Diagnosis:    ICD-10-CM   1. Major depressive disorder with single episode, remission status unspecified  F32.9 buPROPion (WELLBUTRIN XL) 150 MG 24 hr tablet  2. GAD (generalized anxiety disorder)  F41.1   3. Grief  F43.21     I connected with Karina Brown on 06/29/19 at  4:15 PM EDT by telephone and verified that I am speaking with the correct person using two identifiers.   I discussed the limitations of evaluation and management by telemedicine and the availability of in person appointments. The patient expressed understanding and agreed to proceed. History of Present Illness: Patient is a 69 years old currently widowed African-American female referred by primary care physician for management of depression    Patient states she has suffered from depression for 40 years she has had a difficult first marriage started at age 82 she has suffered from depression.  She has been on Lamictal in different medication but currently she is on Lamictal and Effexor  Feels better on wellbutrin which was added On effexor for a long time, pcp fills up meds including lamictal. No rash   Modifying factors; daughter, grandkids, SSI Aggravating factors; multiple medical conditions, widow, back pain. lonliness Duration 50 plu syears      Past Psychiatric History: depression  Previous Psychotropic Medications: Yes   Substance Abuse History in the last 12 months:  No.  Consequences of Substance Abuse: NA  Past Medical History:  Past Medical History:  Diagnosis Date  . Lumbar spinal stenosis 12/24/2015  . Primary osteoarthritis of left knee 12/24/2015   Disc with x-rays of the back and knees return to patient    Past Surgical History:  Procedure Laterality Date  . breast cyst removal    . BREAST REDUCTION  SURGERY    . KNEE ARTHROSCOPY    . SPINE SURGERY      Family Psychiatric History: mom anxiety  Family History:  Family History  Problem Relation Age of Onset  . Heart disease Maternal Grandmother   . Stomach cancer Maternal Aunt     Social History:   Social History   Socioeconomic History  . Marital status: Widowed    Spouse name: Not on file  . Number of children: Not on file  . Years of education: Not on file  . Highest education level: Not on file  Occupational History  . Occupation: retired  Tobacco Use  . Smoking status: Never Smoker  . Smokeless tobacco: Never Used  Substance and Sexual Activity  . Alcohol use: No  . Drug use: No  . Sexual activity: Never  Other Topics Concern  . Not on file  Social History Narrative  . Not on file   Social Determinants of Health   Financial Resource Strain:   . Difficulty of Paying Living Expenses:   Food Insecurity:   . Worried About Programme researcher, broadcasting/film/video in the Last Year:   . Barista in the Last Year:   Transportation Needs:   . Freight forwarder (Medical):   Marland Kitchen Lack of Transportation (Non-Medical):   Physical Activity:   . Days of Exercise per Week:   . Minutes of Exercise per Session:   Stress:   . Feeling of Stress :   Social Connections:   . Frequency of Communication with Friends and Family:   .  Frequency of Social Gatherings with Friends and Family:   . Attends Religious Services:   . Active Member of Clubs or Organizations:   . Attends Archivist Meetings:   Marland Kitchen Marital Status:      Allergies:   Allergies  Allergen Reactions  . Pravastatin Other (See Comments)    Elevated CK  . Lexapro  [Escitalopram Oxalate]   . Rosuvastatin Calcium   . Doxycycline Nausea Only  . Lisinopril Cough  . Olanzapine Swelling  . Simvastatin Other (See Comments)    Per pt it causes muscle pain  . Voltaren [Diclofenac Sodium] Other (See Comments)  . Zoloft [Sertraline Hcl]     Metabolic Disorder  Labs: No results found for: HGBA1C, MPG No results found for: PROLACTIN No results found for: CHOL, TRIG, HDL, CHOLHDL, VLDL, LDLCALC No results found for: TSH  Therapeutic Level Labs: No results found for: LITHIUM No results found for: CBMZ No results found for: VALPROATE  Current Medications: Current Outpatient Medications  Medication Sig Dispense Refill  . amLODipine (NORVASC) 5 MG tablet TAKE 1 TABLET BY MOUTH EVERY DAY    . buPROPion (WELLBUTRIN XL) 150 MG 24 hr tablet Take 1 tablet (150 mg total) by mouth daily. 90 tablet 0  . Cholecalciferol (VITAMIN D) 2000 units CAPS Take by mouth.    . Cyanocobalamin (B-12) 2000 MCG TABS Take by mouth.    . hydrochlorothiazide (HYDRODIURIL) 12.5 MG tablet Take 12.5 mg by mouth daily.    Marland Kitchen lamoTRIgine (LAMICTAL) 100 MG tablet Take by mouth.    . metFORMIN (GLUCOPHAGE-XR) 500 MG 24 hr tablet TAKE 1 TABLET (500 MG TOTAL) BY MOUTH DAILY WITH BREAKFAST    . potassium chloride (K-DUR) 10 MEQ tablet Take by mouth.    . venlafaxine XR (EFFEXOR-XR) 75 MG 24 hr capsule TAKE 3 CAPSULES BY MOUTH EVERY DAY     No current facility-administered medications for this visit.      Psychiatric Specialty Exam: Review of Systems  Psychiatric/Behavioral: Negative for agitation.    There were no vitals taken for this visit.There is no height or weight on file to calculate BMI.  General Appearance:   Eye Contact:   Speech:  Slow  Volume:  Decreased  Mood: fair  Affect:  Congruent  Thought Process:  Goal Directed  Orientation:  Full (Time, Place, and Person)  Thought Content:  Rumination  Suicidal Thoughts:  No  Homicidal Thoughts:  No  Memory:  Immediate;   Fair Recent;   Fair  Judgement:  Fair  Insight:  Shallow  Psychomotor Activity:  Decreased  Concentration:  Concentration: Fair and Attention Span: Fair  Recall:  [employer] of Knowledge:Fair  Language: Fair  Akathisia:  No  Handed:  Right  AIMS (if indicated):  not done  Assets:  Desire  for Improvement  ADL's:  Intact  Cognition: WNL  Sleep:  poor to variable   Screenings:   Assessment and Plan: as follows  MDD moderate recurrent; possible triggers, grief, multiple medical conditions Doing fair, continue wellbutrin, refills sent Has refills of lamictal, effexor [facility]  GAD: continue effexor, doing better Grief; fair, continue meds Insomnia: reviewed sleep hygiene, avoid waking up late or taking naps Distraction from worries   Call for refills   I discussed the assessment and treatment plan with the patient. The patient was provided an opportunity to ask questions and all were answered. The patient agreed with the plan and demonstrated an understanding of the instructions.  The patient was advised to call back or seek an in-person evaluation if the symptoms worsen or if the condition fails to improve as anticipated.  Fu 3-68m Non face to face time spent: or less Thresa Ross, MD 5/20/20214:20 PM

## 2019-09-20 ENCOUNTER — Ambulatory Visit (INDEPENDENT_AMBULATORY_CARE_PROVIDER_SITE_OTHER): Payer: Medicare HMO | Admitting: Sports Medicine

## 2019-09-20 DIAGNOSIS — M65341 Trigger finger, right ring finger: Secondary | ICD-10-CM | POA: Insufficient documentation

## 2019-09-20 NOTE — Progress Notes (Signed)
    Procedures performed today:    Procedure: Real-time Ultrasound Guided injection of the right fourth flexor tendon sheath Device: Samsung [id number]  Verbal informed consent obtained.  Time-out conducted.  Noted no overlying erythema, induration, or other signs of local infection.  Skin prepped in a sterile fashion.  Local anesthesia: Topical Ethyl chloride.  With sterile technique and under real time ultrasound guidance:  1/2 cc lidocaine, 1/2 cc kenalog 40 injected easily  completed without difficulty  Pain immediately resolved suggesting accurate placement of the medication.  Advised to call if fevers/chills, erythema, induration, drainage, or persistent bleeding.  Images permanently stored and available for review in the ultrasound unit.  Impression: Technically successful ultrasound guided injection.  Independent interpretation of notes and tests performed by another provider:   None.  Brief History, Exam, Impression, and Recommendations:    Trigger finger, right ring finger Karina Brown is a pleasant 69 year old female with increasing pain in her right palm, she does have locking of her ring finger all consistent with stenosing tenosynovitis. Today we injected her fourth flexor tendon sheath, adding rehab exercises, return to see me in a month.    ___________________________________________ Ihor Austin. Benjamin Stain, M.D., ABFM., CAQSM. Primary Care and Sports Medicine  [facility]  Adjunct Instructor of Family Medicine  [employer]

## 2019-09-20 NOTE — Assessment & Plan Note (Signed)
Karina Brown is a pleasant 69 year old female with increasing pain in her right palm, she does have locking of her ring finger all consistent with stenosing tenosynovitis. Today we injected her fourth flexor tendon sheath, adding rehab exercises, return to see me in a month.

## 2019-10-18 ENCOUNTER — Ambulatory Visit: Payer: Medicare HMO | Admitting: Sports Medicine

## 2019-10-31 ENCOUNTER — Telehealth (INDEPENDENT_AMBULATORY_CARE_PROVIDER_SITE_OTHER): Payer: Medicare HMO | Admitting: Psychiatry

## 2019-10-31 ENCOUNTER — Encounter (HOSPITAL_COMMUNITY): Payer: Self-pay | Admitting: Psychiatry

## 2019-10-31 DIAGNOSIS — F329 Major depressive disorder, single episode, unspecified: Secondary | ICD-10-CM | POA: Diagnosis not present

## 2019-10-31 DIAGNOSIS — F4321 Adjustment disorder with depressed mood: Secondary | ICD-10-CM | POA: Diagnosis not present

## 2019-10-31 DIAGNOSIS — F411 Generalized anxiety disorder: Secondary | ICD-10-CM | POA: Diagnosis not present

## 2019-10-31 MED ORDER — BUPROPION HCL 75 MG PO TABS: 75.0000 mg | ORAL_TABLET | Freq: Every morning | ORAL | 1 refills | Status: DC

## 2019-10-31 NOTE — Progress Notes (Signed)
[facility] Follow up visit  Patient Identification: Karina Brown MRN:  [id number] Date of Evaluation:  10/31/2019 Referral Source: primary care  Chief Complaint:  Depression follow up  Visit Diagnosis:    ICD-10-CM   1. Major depressive disorder with single episode, remission status unspecified  F32.9   2. GAD (generalized anxiety disorder)  F41.1   3. Grief  F43.21      I connected with [name] on 10/31/19 at  4:15 PM EDT by telephone and verified that I am speaking with the correct person using two identifiers.   I discussed the limitations of evaluation and management by telemedicine and the availability of in person appointments. The patient expressed understanding and agreed to proceed. Patient location home Provider location : home office  History of Present Illness: Patient is a 69 years old currently widowed African-American female referred by primary care physician for management of depression    Patient states she has suffered from depression for 40 years she has had a difficult first marriage started at age 51 she has suffered from depression.  She has been on Lamictal in different medication but currently she is on Lamictal and Effexor   Last visit was doing better with wellbutrin added prior to that Now feels tired, somewhat withdrawn or decrease energy to do things.  She is not sure if its depression related or medical comorbidity   Modifying factors; daughter,grandkids, SSI Aggravating factors; multiple medical conditions, widow, back pain. lonliness Duration 50 plus years Severity feels tired      Past Psychiatric History: depression  Previous Psychotropic Medications: Yes   Substance Abuse History in the last 12 months:  No.  Consequences of Substance Abuse: NA  Past Medical History:  Past Medical History:  Diagnosis Date  . Lumbar spinal stenosis 12/24/2015  . Primary osteoarthritis of left knee 12/24/2015   Disc with x-rays of the back and knees  return to patient    Past Surgical History:  Procedure Laterality Date  . breast cyst removal    . BREAST REDUCTION SURGERY    . KNEE ARTHROSCOPY    . SPINE SURGERY      Family Psychiatric History: mom anxiety  Family History:  Family History  Problem Relation Age of Onset  . Heart disease Maternal Grandmother   . Stomach cancer Maternal Aunt     Social History:   Social History   Socioeconomic History  . Marital status: Widowed    Spouse name: Not on file  . Number of children: Not on file  . Years of education: Not on file  . Highest education level: Not on file  Occupational History  . Occupation: retired  Tobacco Use  . Smoking status: Never Smoker  . Smokeless tobacco: Never Used  Vaping Use  . Vaping Use: Never used  Substance and Sexual Activity  . Alcohol use: No  . Drug use: No  . Sexual activity: Never  Other Topics Concern  . Not on file  Social History Narrative  . Not on file   Social Determinants of Health   Financial Resource Strain:   . Difficulty of Paying Living Expenses: Not on file  Food Insecurity:   . Worried About Programme researcher, broadcasting/film/video in the Last Year: Not on file  . Ran Out of Food in the Last Year: Not on file  Transportation Needs:   . Lack of Transportation (Medical): Not on file  . Lack of Transportation (Non-Medical): Not on file  Physical Activity:   .  Days of Exercise per Week: Not on file  . Minutes of Exercise per Session: Not on file  Stress:   . Feeling of Stress : Not on file  Social Connections:   . Frequency of Communication with Friends and Family: Not on file  . Frequency of Social Gatherings with Friends and Family: Not on file  . Attends Religious Services: Not on file  . Active Member of Clubs or Organizations: Not on file  . Attends Banker Meetings: Not on file  . Marital Status: Not on file     Allergies:   Allergies  Allergen Reactions  . Pravastatin Other (See Comments)    Elevated CK   . Lexapro  [Escitalopram Oxalate]   . Rosuvastatin Calcium   . Doxycycline Nausea Only  . Lisinopril Cough  . Olanzapine Swelling  . Simvastatin Other (See Comments)    Per pt it causes muscle pain  . Voltaren [Diclofenac Sodium] Other (See Comments)  . Zoloft [Sertraline Hcl]     Metabolic Disorder Labs: No results found for: HGBA1C, MPG No results found for: PROLACTIN No results found for: CHOL, TRIG, HDL, CHOLHDL, VLDL, LDLCALC No results found for: TSH  Therapeutic Level Labs: No results found for: LITHIUM No results found for: CBMZ No results found for: VALPROATE  Current Medications: Current Outpatient Medications  Medication Sig Dispense Refill  . amLODipine (NORVASC) 5 MG tablet TAKE 1 TABLET BY MOUTH EVERY DAY    . buPROPion (WELLBUTRIN) 75 MG tablet Take 1 tablet (75 mg total) by mouth in the morning. 30 tablet 1  . Cholecalciferol (VITAMIN D) 2000 units CAPS Take by mouth.    . Cyanocobalamin (B-12) 2000 MCG TABS Take by mouth.    . hydrochlorothiazide (HYDRODIURIL) 12.5 MG tablet Take 12.5 mg by mouth daily.    Marland Kitchen lamoTRIgine (LAMICTAL) 100 MG tablet Take by mouth.    . metFORMIN (GLUCOPHAGE-XR) 500 MG 24 hr tablet TAKE 1 TABLET (500 MG TOTAL) BY MOUTH DAILY WITH BREAKFAST    . potassium chloride (K-DUR) 10 MEQ tablet Take by mouth.    . venlafaxine XR (EFFEXOR-XR) 75 MG 24 hr capsule TAKE 3 CAPSULES BY MOUTH EVERY DAY     No current facility-administered medications for this visit.      Psychiatric Specialty Exam: Review of Systems  Psychiatric/Behavioral: Negative for agitation.    There were no vitals taken for this visit.There is no height or weight on file to calculate BMI.  General Appearance:   Eye Contact:   Speech:  Slow  Volume:  Decreased  Mood  Somewhat subdued  Affect:  Congruent  Thought Process:  Goal Directed  Orientation:  Full (Time, Place, and Person)  Thought Content:  Rumination  Suicidal Thoughts:  No  Homicidal Thoughts:  No   Memory:  Immediate;   Fair Recent;   Fair  Judgement:  Fair  Insight:  Shallow  Psychomotor Activity:  Decreased  Concentration:  Concentration: Fair and Attention Span: Fair  Recall:  [employer] of Knowledge:Fair  Language: Fair  Akathisia:  No  Handed:  Right  AIMS (if indicated):  not done  Assets:  Desire for Improvement  ADL's:  Intact  Cognition: WNL  Sleep:  poor to variable   Screenings:   Assessment and Plan: as follows  MDD moderate recurrent; somewhat tired, handling grief, feels subdued at times, increase wellbutrin to 150XL, add 75mg  wellbutrin  Has refills of lamictal, effexor from primayr care  GAD: fluctuates, continue effexor,  Work on distractions and adding more activities Grief; manageable continue meds, add activitites, increase wellbutrin as above Insomnia: reviewed sleep hygiene, avoid waking up late or taking naps Distraction from worries   Call for refills   I discussed the assessment and treatment plan with the patient. The patient was provided an opportunity to ask questions and all were answered. The patient agreed with the plan and demonstrated an understanding of the instructions.   The patient was advised to call back or seek an in-person evaluation if the symptoms worsen or if the condition fails to improve as anticipated.  Fu 6 weeks or earlier if needed  Non face to face time spent: 20 min Thresa Ross, MD 9/21/20214:34 PM

## 2019-12-18 ENCOUNTER — Telehealth (INDEPENDENT_AMBULATORY_CARE_PROVIDER_SITE_OTHER): Payer: Medicare HMO | Admitting: Psychiatry

## 2019-12-18 ENCOUNTER — Encounter (HOSPITAL_COMMUNITY): Payer: Self-pay | Admitting: Psychiatry

## 2019-12-18 DIAGNOSIS — F411 Generalized anxiety disorder: Secondary | ICD-10-CM

## 2019-12-18 DIAGNOSIS — F329 Major depressive disorder, single episode, unspecified: Secondary | ICD-10-CM

## 2019-12-18 DIAGNOSIS — F4321 Adjustment disorder with depressed mood: Secondary | ICD-10-CM | POA: Diagnosis not present

## 2019-12-18 MED ORDER — BUPROPION HCL ER (XL) 150 MG PO TB24: 150.0000 mg | ORAL_TABLET | ORAL | 1 refills | Status: DC

## 2019-12-18 NOTE — Progress Notes (Addendum)
[facility] Follow up visit  Patient Identification: Karina Brown MRN:  [id number] Date of Evaluation:  12/18/2019 Referral Source: primary care  Chief Complaint:  depression Visit Diagnosis:    ICD-10-CM   1. Major depressive disorder with single episode, remission status unspecified  F32.9   2. GAD (generalized anxiety disorder)  F41.1   3. Grief  F43.21     I connected with Karina Brown on 12/18/19 at  4:30 PM EST by telephone enabled telemedicine application and verified that I am speaking with the correct person using two identifiers.   I discussed the limitations of evaluation and management by telemedicine and the availability of in person appointments. The patient expressed understanding and agreed to proceed. Patient location home Provider location home office  History of Present Illness: Patient is a 69 years old currently widowed African-American female referred by primary care physician for management of depression  She has been diagnosed with Covid in September and her mom is also sick with Covid she died of aspiration pneumonia.  Last visit increased wellbutrin but feels mind racy , otherwise depression manageable tries to keep busy Also on lamictal and effexor  pcp fills up meds including lamictal. No rash   Modifying factors; her daughter, SSI Aggravating factors; multiple medical conditions, widow, back pain. lonliness Duration 50 plu syears      Past Psychiatric History: depression  Previous Psychotropic Medications: Yes   Substance Abuse History in the last 12 months:  No.  Consequences of Substance Abuse: NA  Past Medical History:  Past Medical History:  Diagnosis Date  . Lumbar spinal stenosis 12/24/2015  . Primary osteoarthritis of left knee 12/24/2015   Disc with x-rays of the back and knees return to patient    Past Surgical History:  Procedure Laterality Date  . breast cyst removal    . BREAST REDUCTION SURGERY    . KNEE ARTHROSCOPY    . SPINE  SURGERY      Family Psychiatric History: mom anxiety  Family History:  Family History  Problem Relation Age of Onset  . Heart disease Maternal Grandmother   . Stomach cancer Maternal Aunt     Social History:   Social History   Socioeconomic History  . Marital status: Widowed    Spouse name: Not on file  . Number of children: Not on file  . Years of education: Not on file  . Highest education level: Not on file  Occupational History  . Occupation: retired  Tobacco Use  . Smoking status: Never Smoker  . Smokeless tobacco: Never Used  Vaping Use  . Vaping Use: Never used  Substance and Sexual Activity  . Alcohol use: No  . Drug use: No  . Sexual activity: Never  Other Topics Concern  . Not on file  Social History Narrative  . Not on file   Social Determinants of Health   Financial Resource Strain:   . Difficulty of Paying Living Expenses: Not on file  Food Insecurity:   . Worried About Programme researcher, broadcasting/film/video in the Last Year: Not on file  . Ran Out of Food in the Last Year: Not on file  Transportation Needs:   . Lack of Transportation (Medical): Not on file  . Lack of Transportation (Non-Medical): Not on file  Physical Activity:   . Days of Exercise per Week: Not on file  . Minutes of Exercise per Session: Not on file  Stress:   . Feeling of Stress : Not on file  Social  Connections:   . Frequency of Communication with Friends and Family: Not on file  . Frequency of Social Gatherings with Friends and Family: Not on file  . Attends Religious Services: Not on file  . Active Member of Clubs or Organizations: Not on file  . Attends Banker Meetings: Not on file  . Marital Status: Not on file     Allergies:   Allergies  Allergen Reactions  . Pravastatin Other (See Comments)    Elevated CK  . Lexapro  [Escitalopram Oxalate]   . Rosuvastatin Calcium   . Doxycycline Nausea Only  . Lisinopril Cough  . Olanzapine Swelling  . Simvastatin Other (See  Comments)    Per pt it causes muscle pain  . Voltaren [Diclofenac Sodium] Other (See Comments)  . Zoloft [Sertraline Hcl]     Metabolic Disorder Labs: No results found for: HGBA1C, MPG No results found for: PROLACTIN No results found for: CHOL, TRIG, HDL, CHOLHDL, VLDL, LDLCALC No results found for: TSH  Therapeutic Level Labs: No results found for: LITHIUM No results found for: CBMZ No results found for: VALPROATE  Current Medications: Current Outpatient Medications  Medication Sig Dispense Refill  . amLODipine (NORVASC) 5 MG tablet TAKE 1 TABLET BY MOUTH EVERY DAY    . buPROPion (WELLBUTRIN XL) 150 MG 24 hr tablet Take 1 tablet (150 mg total) by mouth every morning. 30 tablet 1  . Cholecalciferol (VITAMIN D) 2000 units CAPS Take by mouth.    . Cyanocobalamin (B-12) 2000 MCG TABS Take by mouth.    . hydrochlorothiazide (HYDRODIURIL) 12.5 MG tablet Take 12.5 mg by mouth daily.    Marland Kitchen lamoTRIgine (LAMICTAL) 100 MG tablet Take by mouth.    . metFORMIN (GLUCOPHAGE-XR) 500 MG 24 hr tablet TAKE 1 TABLET (500 MG TOTAL) BY MOUTH DAILY WITH BREAKFAST    . potassium chloride (K-DUR) 10 MEQ tablet Take by mouth.    . venlafaxine XR (EFFEXOR-XR) 75 MG 24 hr capsule TAKE 3 CAPSULES BY MOUTH EVERY DAY     No current facility-administered medications for this visit.      Psychiatric Specialty Exam: Review of Systems  Psychiatric/Behavioral: Negative for agitation.    There were no vitals taken for this visit.There is no height or weight on file to calculate BMI.  General Appearance: Casual  Eye Contact:  Fair  Speech:  Slow  Volume:  Decreased  Mood: fair  Affect:  Congruent  Thought Process:  Goal Directed  Orientation:  Full (Time, Place, and Person)  Thought Content:  Rumination  Suicidal Thoughts:  No  Homicidal Thoughts:  No  Memory:  Immediate;   Fair Recent;   Fair  Judgement:  Fair  Insight:  Shallow  Psychomotor Activity:  Decreased  Concentration:  Concentration:  Fair and Attention Span: Fair  Recall:  [employer] of Knowledge:Fair  Language: Fair  Akathisia:  No  Handed:  Right  AIMS (if indicated):  not done  Assets:  Desire for Improvement  ADL's:  Intact  Cognition: WNL  Sleep:  poor to variable   Screenings:   Assessment and Plan: as follows  MDD moderate recurrent; fair, mind racy somewhat, will lower wellbutrin from 225mg  to 150mg  xl Consider therapy if needed  GAD: continue effexor,handling grief some better Grief; some better, continue meds Insomnia: reviewed sleep hygiene, avoid waking up late or taking naps Distraction from worries   wellbutrin refills sent  I discussed the assessment and treatment plan with the patient. The patient was  provided an opportunity to ask questions and all were answered. The patient agreed with the plan and demonstrated an understanding of the instructions.   The patient was advised to call back or seek an in-person evaluation if the symptoms worsen or if the condition fails to improve as anticipated. Non face to face time spent Fu 2 - 5m or earlier if needed  Thresa Ross, MD 11/8/20214:29 PM

## 2020-02-26 ENCOUNTER — Encounter (HOSPITAL_COMMUNITY): Payer: Self-pay | Admitting: Psychiatry

## 2020-02-26 ENCOUNTER — Telehealth (INDEPENDENT_AMBULATORY_CARE_PROVIDER_SITE_OTHER): Payer: Medicare HMO | Admitting: Psychiatry

## 2020-02-26 DIAGNOSIS — F411 Generalized anxiety disorder: Secondary | ICD-10-CM | POA: Diagnosis not present

## 2020-02-26 DIAGNOSIS — F329 Major depressive disorder, single episode, unspecified: Secondary | ICD-10-CM

## 2020-02-26 MED ORDER — BUPROPION HCL ER (XL) 150 MG PO TB24: 150.0000 mg | ORAL_TABLET | Freq: Two times a day (BID) | ORAL | 0 refills | Status: DC

## 2020-02-26 NOTE — Progress Notes (Signed)
[facility] Follow up visit  Patient Identification: Karina Brown MRN:  [id number] Date of Evaluation:  02/26/2020 Referral Source: primary care  Chief Complaint:  depression follow up Visit Diagnosis:    ICD-10-CM   1. Major depressive disorder with single episode, remission status unspecified  F32.9   2. GAD (generalized anxiety disorder)  F41.1     Connected with patient via tele audio 4;15pm. enabled telemedicine application and verified that I am speaking with the correct person using two identifiers.   I discussed the limitations of evaluation and management by telemedicine and the availability of in person appointments. The patient expressed understanding and agreed to proceed. Patient location home Provider location home office  History of Present Illness: Patient is a 70 years old currently widowed African-American female referred by primary care physician for management of depression  Feeling tired, faitgue, has had covid pneumonia  Sleeps late Feels med need adjusted, feeling subdued  Have lowered wellbutrin last visit  pcp fills up meds including lamictal. No rash   Modifying factors; daughter, SSI Aggravating factors; multiple medical conditions, widow, back pain. lonliness Duration 50 plu syears      Past Psychiatric History: depression  Previous Psychotropic Medications: Yes   Substance Abuse History in the last 12 months:  No.  Consequences of Substance Abuse: NA  Past Medical History:  Past Medical History:  Diagnosis Date  . Lumbar spinal stenosis 12/24/2015  . Primary osteoarthritis of left knee 12/24/2015   Disc with x-rays of the back and knees return to patient    Past Surgical History:  Procedure Laterality Date  . breast cyst removal    . BREAST REDUCTION SURGERY    . KNEE ARTHROSCOPY    . SPINE SURGERY      Family Psychiatric History: mom anxiety  Family History:  Family History  Problem Relation Age of Onset  . Heart disease Maternal  Grandmother   . Stomach cancer Maternal Aunt     Social History:   Social History   Socioeconomic History  . Marital status: Widowed    Spouse name: Not on file  . Number of children: Not on file  . Years of education: Not on file  . Highest education level: Not on file  Occupational History  . Occupation: retired  Tobacco Use  . Smoking status: Never Smoker  . Smokeless tobacco: Never Used  Vaping Use  . Vaping Use: Never used  Substance and Sexual Activity  . Alcohol use: No  . Drug use: No  . Sexual activity: Never  Other Topics Concern  . Not on file  Social History Narrative  . Not on file   Social Determinants of Health   Financial Resource Strain: Not on file  Food Insecurity: Not on file  Transportation Needs: Not on file  Physical Activity: Not on file  Stress: Not on file  Social Connections: Not on file     Allergies:   Allergies  Allergen Reactions  . Pravastatin Other (See Comments)    Elevated CK  . Lexapro  [Escitalopram Oxalate]   . Rosuvastatin Calcium   . Doxycycline Nausea Only  . Lisinopril Cough  . Olanzapine Swelling  . Simvastatin Other (See Comments)    Per pt it causes muscle pain  . Voltaren [Diclofenac Sodium] Other (See Comments)  . Zoloft [Sertraline Hcl]     Metabolic Disorder Labs: No results found for: HGBA1C, MPG No results found for: PROLACTIN No results found for: CHOL, TRIG, HDL, CHOLHDL, VLDL, LDLCALC No  results found for: TSH  Therapeutic Level Labs: No results found for: LITHIUM No results found for: CBMZ No results found for: VALPROATE  Current Medications: Current Outpatient Medications  Medication Sig Dispense Refill  . amLODipine (NORVASC) 5 MG tablet TAKE 1 TABLET BY MOUTH EVERY DAY    . buPROPion (WELLBUTRIN XL) 150 MG 24 hr tablet Take 1 tablet (150 mg total) by mouth in the morning and at bedtime. 60 tablet 0  . Cholecalciferol (VITAMIN D) 2000 units CAPS Take by mouth.    . Cyanocobalamin (B-12)  2000 MCG TABS Take by mouth.    . hydrochlorothiazide (HYDRODIURIL) 12.5 MG tablet Take 12.5 mg by mouth daily.    Marland Kitchen lamoTRIgine (LAMICTAL) 100 MG tablet Take by mouth.    . metFORMIN (GLUCOPHAGE-XR) 500 MG 24 hr tablet TAKE 1 TABLET (500 MG TOTAL) BY MOUTH DAILY WITH BREAKFAST    . potassium chloride (K-DUR) 10 MEQ tablet Take by mouth.    . venlafaxine XR (EFFEXOR-XR) 75 MG 24 hr capsule TAKE 3 CAPSULES BY MOUTH EVERY DAY     No current facility-administered medications for this visit.      Psychiatric Specialty Exam: Review of Systems  Psychiatric/Behavioral: Positive for dysphoric mood. Negative for agitation.    There were no vitals taken for this visit.There is no height or weight on file to calculate BMI.  General Appearance:   Eye Contact:  Speech:  Slow  Volume:  Decreased  Mood:subdued  Affect:  Congruent  Thought Process:  Goal Directed  Orientation:  Full (Time, Place, and Person)  Thought Content:  Rumination  Suicidal Thoughts:  No  Homicidal Thoughts:  No  Memory:  Immediate;   Fair Recent;   Fair  Judgement:  Fair  Insight:  Shallow  Psychomotor Activity:  Decreased  Concentration:  Concentration: Fair and Attention Span: Fair  Recall:  [employer] of Knowledge:Fair  Language: Fair  Akathisia:  No  Handed:  Right  AIMS (if indicated):  not done  Assets:  Desire for Improvement  ADL's:  Intact  Cognition: WNL  Sleep:  poor to variable   Screenings:   Assessment and Plan: as follows  MDD moderate recurrent; feeling subdued, increase wellbutrin to 150mg  bid. Continue lamictal and effexor GAD: continue effexor,handling grief fair  Grief; some better, continue meds Insomnia: reviewed sleep hygiene, avoid waking up late or taking naps Distraction from worries     I discussed the assessment and treatment plan with the patient. The patient was provided an opportunity to ask questions and all were answered. The patient agreed with the plan and  demonstrated an understanding of the instructions.   The patient was advised to call back or seek an in-person evaluation if the symptoms worsen or if the condition fails to improve as anticipated. Non face to face time spent: 15 min Fu 1 m or earlier if needed  , MD 1/17/20224:28 PM

## 2020-04-03 ENCOUNTER — Other Ambulatory Visit (HOSPITAL_COMMUNITY): Payer: Self-pay

## 2020-04-03 MED ORDER — BUPROPION HCL ER (XL) 150 MG PO TB24: 150.0000 mg | ORAL_TABLET | Freq: Two times a day (BID) | ORAL | 0 refills | Status: DC

## 2020-04-08 ENCOUNTER — Encounter (HOSPITAL_COMMUNITY): Payer: Self-pay | Admitting: Psychiatry

## 2020-04-08 ENCOUNTER — Telehealth (INDEPENDENT_AMBULATORY_CARE_PROVIDER_SITE_OTHER): Payer: Medicare HMO | Admitting: Psychiatry

## 2020-04-08 DIAGNOSIS — F4321 Adjustment disorder with depressed mood: Secondary | ICD-10-CM | POA: Diagnosis not present

## 2020-04-08 DIAGNOSIS — F329 Major depressive disorder, single episode, unspecified: Secondary | ICD-10-CM

## 2020-04-08 DIAGNOSIS — F411 Generalized anxiety disorder: Secondary | ICD-10-CM

## 2020-04-08 MED ORDER — BUPROPION HCL ER (XL) 150 MG PO TB24: 150.0000 mg | ORAL_TABLET | Freq: Two times a day (BID) | ORAL | 2 refills | Status: DC

## 2020-04-08 NOTE — Progress Notes (Signed)
[facility] Follow up visit  Patient Identification: Karina Brown MRN:  [id number] Date of Evaluation:  04/08/2020 Referral Source: primary care  Chief Complaint:  depression follow up Visit Diagnosis:    ICD-10-CM   1. Major depressive disorder with single episode, remission status unspecified  F32.9   2. GAD (generalized anxiety disorder)  F41.1   3. Grief  F43.21     Virtual Visit via Telephone Note  I connected with Karina Brown on 04/08/20 at  4:00 PM EST by telephone and verified that I am speaking with the correct person using two identifiers.  Location: Patient: home Provider: home office   I discussed the limitations, risks, security and privacy concerns of performing an evaluation and management service by telephone and the availability of in person appointments. I also discussed with the patient that there may be a patient responsible charge related to this service. The patient expressed understanding and agreed to proceed.     I discussed the assessment and treatment plan with the patient. The patient was provided an opportunity to ask questions and all were answered. The patient agreed with the plan and demonstrated an understanding of the instructions.   The patient was advised to call back or seek an in-person evaluation if the symptoms worsen or if the condition fails to improve as anticipated.  I provided 11  minutes of non-face-to-face time during this encounter.   Thresa Ross, MD   History of Present Illness: Patient is a 70 years old currently widowed African-American female referred by primary care physician for management of depression  Last visit was subdued, increase in wellbutrin has helped, feels better  pcp fills up meds including lamictal. No rash   Modifying factors; daughter, SSI Aggravating factors; multiple medical conditions, widow, back pain. lonliness Duration 50 plu syears      Past Psychiatric History: depression  Previous Psychotropic  Medications: Yes   Substance Abuse History in the last 12 months:  No.  Consequences of Substance Abuse: NA  Past Medical History:  Past Medical History:  Diagnosis Date  . Lumbar spinal stenosis 12/24/2015  . Primary osteoarthritis of left knee 12/24/2015   Disc with x-rays of the back and knees return to patient    Past Surgical History:  Procedure Laterality Date  . breast cyst removal    . BREAST REDUCTION SURGERY    . KNEE ARTHROSCOPY    . SPINE SURGERY      Family Psychiatric History: mom anxiety  Family History:  Family History  Problem Relation Age of Onset  . Heart disease Maternal Grandmother   . Stomach cancer Maternal Aunt     Social History:   Social History   Socioeconomic History  . Marital status: Widowed    Spouse name: Not on file  . Number of children: Not on file  . Years of education: Not on file  . Highest education level: Not on file  Occupational History  . Occupation: retired  Tobacco Use  . Smoking status: Never Smoker  . Smokeless tobacco: Never Used  Vaping Use  . Vaping Use: Never used  Substance and Sexual Activity  . Alcohol use: No  . Drug use: No  . Sexual activity: Never  Other Topics Concern  . Not on file  Social History Narrative  . Not on file   Social Determinants of Health   Financial Resource Strain: Not on file  Food Insecurity: Not on file  Transportation Needs: Not on file  Physical Activity: Not on  file  Stress: Not on file  Social Connections: Not on file     Allergies:   Allergies  Allergen Reactions  . Pravastatin Other (See Comments)    Elevated CK  . Lexapro  [Escitalopram Oxalate]   . Rosuvastatin Calcium   . Doxycycline Nausea Only  . Lisinopril Cough  . Olanzapine Swelling  . Simvastatin Other (See Comments)    Per pt it causes muscle pain  . Voltaren [Diclofenac Sodium] Other (See Comments)  . Zoloft [Sertraline Hcl]     Metabolic Disorder Labs: No results found for: HGBA1C,  MPG No results found for: PROLACTIN No results found for: CHOL, TRIG, HDL, CHOLHDL, VLDL, LDLCALC No results found for: TSH  Therapeutic Level Labs: No results found for: LITHIUM No results found for: CBMZ No results found for: VALPROATE  Current Medications: Current Outpatient Medications  Medication Sig Dispense Refill  . amLODipine (NORVASC) 5 MG tablet TAKE 1 TABLET BY MOUTH EVERY DAY    . buPROPion (WELLBUTRIN XL) 150 MG 24 hr tablet Take 1 tablet (150 mg total) by mouth in the morning and at bedtime. 60 tablet 2  . Cholecalciferol (VITAMIN D) 2000 units CAPS Take by mouth.    . Cyanocobalamin (B-12) 2000 MCG TABS Take by mouth.    . hydrochlorothiazide (HYDRODIURIL) 12.5 MG tablet Take 12.5 mg by mouth daily.    Marland Kitchen lamoTRIgine (LAMICTAL) 100 MG tablet Take by mouth.    . metFORMIN (GLUCOPHAGE-XR) 500 MG 24 hr tablet TAKE 1 TABLET (500 MG TOTAL) BY MOUTH DAILY WITH BREAKFAST    . potassium chloride (K-DUR) 10 MEQ tablet Take by mouth.    . venlafaxine XR (EFFEXOR-XR) 75 MG 24 hr capsule TAKE 3 CAPSULES BY MOUTH EVERY DAY     No current facility-administered medications for this visit.      Psychiatric Specialty Exam: Review of Systems  Psychiatric/Behavioral: Negative for agitation.    There were no vitals taken for this visit.There is no height or weight on file to calculate BMI.  General Appearance:   Eye Contact:  Speech:  Slow  Volume:  Decreased  Mood:fair  Affect:  Congruent  Thought Process:  Goal Directed  Orientation:  Full (Time, Place, and Person)  Thought Content:  Rumination  Suicidal Thoughts:  No  Homicidal Thoughts:  No  Memory:  Immediate;   Fair Recent;   Fair  Judgement:  Fair  Insight:  Shallow  Psychomotor Activity:  Decreased  Concentration:  Concentration: Fair and Attention Span: Fair  Recall:  [employer] of Knowledge:Fair  Language: Fair  Akathisia:  No  Handed:  Right  AIMS (if indicated):  not done  Assets:  Desire for Improvement   ADL's:  Intact  Cognition: WNL  Sleep:  poor to variable   Screenings: PHQ2-9   Flowsheet Row Video Visit from 04[facility]08/2020 in [facility] [place]  PHQ-2 Total Score 1    Flowsheet Row Video Visit from 04/08/2020 in [facility] AT [place]  C-SSRS RISK CATEGORY No Risk      Assessment and Plan: as follows  MDD moderate recurrent; better, continue wellbutrin takes 150mg  2 a day. Continue lamictal, effexor Reviewed meds GAD: fair, continue effexor Add activities during the day   Insomnia: reviewed sleep hygiene, avoid waking up late or taking naps     Fu 32m 3m, MD 2/28/20224:14 PM

## 2020-07-01 ENCOUNTER — Encounter (HOSPITAL_COMMUNITY): Payer: Self-pay | Admitting: Psychiatry

## 2020-07-01 ENCOUNTER — Telehealth (INDEPENDENT_AMBULATORY_CARE_PROVIDER_SITE_OTHER): Payer: Medicare HMO | Admitting: Psychiatry

## 2020-07-01 DIAGNOSIS — F411 Generalized anxiety disorder: Secondary | ICD-10-CM | POA: Diagnosis not present

## 2020-07-01 DIAGNOSIS — F4321 Adjustment disorder with depressed mood: Secondary | ICD-10-CM | POA: Diagnosis not present

## 2020-07-01 DIAGNOSIS — F329 Major depressive disorder, single episode, unspecified: Secondary | ICD-10-CM | POA: Diagnosis not present

## 2020-07-01 MED ORDER — BUPROPION HCL ER (XL) 150 MG PO TB24: 150.0000 mg | ORAL_TABLET | Freq: Two times a day (BID) | ORAL | 2 refills | Status: DC

## 2020-07-01 NOTE — Progress Notes (Signed)
[facility] Follow up visit  Patient Identification: Karina Brown MRN:  [id number] Date of Evaluation:  07/01/2020 Referral Source: primary care  Chief Complaint:  depression follow up Visit Diagnosis:    ICD-10-CM   1. Major depressive disorder with single episode, remission status unspecified  F32.9   2. GAD (generalized anxiety disorder)  F41.1   3. Grief  F43.21    Virtual Visit via Telephone Note  I connected with Karina Brown on 07/01/20 at  4:00 PM EDT by telephone and verified that I am speaking with the correct person using two identifiers.  Location: Patient: home Provider: home office   I discussed the limitations, risks, security and privacy concerns of performing an evaluation and management service by telephone and the availability of in person appointments. I also discussed with the patient that there may be a patient responsible charge related to this service. The patient expressed understanding and agreed to proceed.     I discussed the assessment and treatment plan with the patient. The patient was provided an opportunity to ask questions and all were answered. The patient agreed with the plan and demonstrated an understanding of the instructions.   The patient was advised to call back or seek an in-person evaluation if the symptoms worsen or if the condition fails to improve as anticipated.  I provided 15  minutes of non-face-to-face time during this encounter.        History of Present Illness: Patient is a 70 years old currently widowed African-American female referred by primary care physician for management of depression   Patient was doing fair on medication but lost her grandson a month ago feeling down family is trying to support each other they are going through grief she has scheduled therapy she tries to remember the good days as well he has had asthma attack    pcp fills up meds including lamictal. No rash   Modifying factors; daughter  aggravating  factors; multiple medical conditions, widow, back pain. lonliness recent loss of grandkid  duration 50 plu syears      Past Psychiatric History: depression  Previous Psychotropic Medications: Yes   Substance Abuse History in the last 12 months:  No.  Consequences of Substance Abuse: NA  Past Medical History:  Past Medical History:  Diagnosis Date  . Lumbar spinal stenosis 12/24/2015  . Primary osteoarthritis of left knee 12/24/2015   Disc with x-rays of the back and knees return to patient    Past Surgical History:  Procedure Laterality Date  . breast cyst removal    . BREAST REDUCTION SURGERY    . KNEE ARTHROSCOPY    . SPINE SURGERY      Family Psychiatric History: mom anxiety  Family History:  Family History  Problem Relation Age of Onset  . Heart disease Maternal Grandmother   . Stomach cancer Maternal Aunt     Social History:   Social History   Socioeconomic History  . Marital status: Widowed    Spouse name: Not on file  . Number of children: Not on file  . Years of education: Not on file  . Highest education level: Not on file  Occupational History  . Occupation: retired  Tobacco Use  . Smoking status: Never Smoker  . Smokeless tobacco: Never Used  Vaping Use  . Vaping Use: Never used  Substance and Sexual Activity  . Alcohol use: No  . Drug use: No  . Sexual activity: Never  Other Topics Concern  . Not on  file  Social History Narrative  . Not on file   Social Determinants of Health   Financial Resource Strain: Not on file  Food Insecurity: Not on file  Transportation Needs: Not on file  Physical Activity: Not on file  Stress: Not on file  Social Connections: Not on file     Allergies:   Allergies  Allergen Reactions  . Pravastatin Other (See Comments)    Elevated CK  . Lexapro  [Escitalopram Oxalate]   . Rosuvastatin Calcium   . Doxycycline Nausea Only  . Lisinopril Cough  . Olanzapine Swelling  . Simvastatin Other (See  Comments)    Per pt it causes muscle pain  . Voltaren [Diclofenac Sodium] Other (See Comments)  . Zoloft [Sertraline Hcl]     Metabolic Disorder Labs: No results found for: HGBA1C, MPG No results found for: PROLACTIN No results found for: CHOL, TRIG, HDL, CHOLHDL, VLDL, LDLCALC No results found for: TSH  Therapeutic Level Labs: No results found for: LITHIUM No results found for: CBMZ No results found for: VALPROATE  Current Medications: Current Outpatient Medications  Medication Sig Dispense Refill  . amLODipine (NORVASC) 5 MG tablet TAKE 1 TABLET BY MOUTH EVERY DAY    . buPROPion (WELLBUTRIN XL) 150 MG 24 hr tablet Take 1 tablet (150 mg total) by mouth in the morning and at bedtime. 60 tablet 2  . Cholecalciferol (VITAMIN D) 2000 units CAPS Take by mouth.    . Cyanocobalamin (B-12) 2000 MCG TABS Take by mouth.    . hydrochlorothiazide (HYDRODIURIL) 12.5 MG tablet Take 12.5 mg by mouth daily.    Marland Kitchen lamoTRIgine (LAMICTAL) 100 MG tablet Take by mouth.    . metFORMIN (GLUCOPHAGE-XR) 500 MG 24 hr tablet TAKE 1 TABLET (500 MG TOTAL) BY MOUTH DAILY WITH BREAKFAST    . potassium chloride (K-DUR) 10 MEQ tablet Take by mouth.    . venlafaxine XR (EFFEXOR-XR) 75 MG 24 hr capsule TAKE 3 CAPSULES BY MOUTH EVERY DAY     No current facility-administered medications for this visit.      Psychiatric Specialty Exam: Review of Systems  Psychiatric/Behavioral: Negative for agitation.    There were no vitals taken for this visit.There is no height or weight on file to calculate BMI.  General Appearance:   Eye Contact:  Speech:  Slow  Volume:  Decreased  Mood: Subdued  Affect:  Congruent  Thought Process:  Goal Directed  Orientation:  Full (Time, Place, and Person)  Thought Content:  Rumination  Suicidal Thoughts:  No  Homicidal Thoughts:  No  Memory:  Immediate;   Fair Recent;   Fair  Judgement:  Fair  Insight:  Shallow  Psychomotor Activity:  Decreased  Concentration:   Concentration: Fair and Attention Span: Fair  Recall:  [employer] of Knowledge:Fair  Language: Fair  Akathisia:  No  Handed:  Right  AIMS (if indicated):  not done  Assets:  Desire for Improvement  ADL's:  Intact  Cognition: WNL  Sleep:  poor to variable   Screenings: PHQ2-9   Flowsheet Row Video Visit from 04/08/2020 in [facility] AT [place]  PHQ-2 Total Score 1    Flowsheet Row Video Visit from 07/01/2020 in [facility] AT [place] Video Visit from 04/08/2020 in [facility] AT [place]  C-SSRS RISK CATEGORY No Risk No Risk      Assessment and Plan: as follows  MDD moderate recurrent; subdued going through grief provided supportive therapy she has scheduled  individual therapy as well continue medication weaning Wellbutrin she understands to try to keep her self distracted and family is trying to be supportive of each other  GAD: Fluctuates continue Effexor  add activities during the day   Insomnia: reviewed sleep hygiene, avoid waking up late or taking naps     1 month in office or earlier if needed Thresa Ross, MD 5/23/20224:12 PM

## 2020-07-23 ENCOUNTER — Ambulatory Visit (INDEPENDENT_AMBULATORY_CARE_PROVIDER_SITE_OTHER): Payer: Medicare HMO | Admitting: Licensed Clinical Social Worker

## 2020-07-23 DIAGNOSIS — F411 Generalized anxiety disorder: Secondary | ICD-10-CM | POA: Diagnosis not present

## 2020-07-23 DIAGNOSIS — F331 Major depressive disorder, recurrent, moderate: Secondary | ICD-10-CM | POA: Diagnosis not present

## 2020-07-23 DIAGNOSIS — F4321 Adjustment disorder with depressed mood: Secondary | ICD-10-CM
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
## 2020-08-08 ENCOUNTER — Telehealth (INDEPENDENT_AMBULATORY_CARE_PROVIDER_SITE_OTHER): Payer: Medicare HMO | Admitting: Psychiatry

## 2020-08-08 ENCOUNTER — Encounter (HOSPITAL_COMMUNITY): Payer: Self-pay | Admitting: Psychiatry

## 2020-08-08 DIAGNOSIS — F331 Major depressive disorder, recurrent, moderate: Secondary | ICD-10-CM | POA: Diagnosis not present

## 2020-08-08 DIAGNOSIS — F411 Generalized anxiety disorder: Secondary | ICD-10-CM | POA: Diagnosis not present

## 2020-08-08 DIAGNOSIS — F4321 Adjustment disorder with depressed mood: Secondary | ICD-10-CM

## 2020-08-08 MED ORDER — LAMOTRIGINE 100 MG PO TABS: 150.0000 mg | ORAL_TABLET | Freq: Every day | ORAL | 0 refills | Status: AC

## 2020-08-08 MED ORDER — BUPROPION HCL ER (XL) 300 MG PO TB24: 300.0000 mg | ORAL_TABLET | Freq: Two times a day (BID) | ORAL | 0 refills | Status: DC

## 2020-08-08 NOTE — Progress Notes (Signed)
[facility] Follow up visit  Patient Identification: Kathalene Sporer MRN:  [id number] Date of Evaluation:  08/08/2020 Referral Source: primary care  Chief Complaint:  depression follow up Visit Diagnosis:    ICD-10-CM   1. GAD (generalized anxiety disorder)  F41.1     2. Grief  F43.21     3. Major depressive disorder, recurrent episode, moderate (HCC)  F33.1      Virtual Visit via Telephone Note  I connected with [name] on 08/08/20 at 10:30 AM EDT by telephone and verified that I am speaking with the correct person using two identifiers.  Location: Patient: home Provider: office   I discussed the limitations, risks, security and privacy concerns of performing an evaluation and management service by telephone and the availability of in person appointments. I also discussed with the patient that there may be a patient responsible charge related to this service. The patient expressed understanding and agreed to proceed.     I discussed the assessment and treatment plan with the patient. The patient was provided an opportunity to ask questions and all were answered. The patient agreed with the plan and demonstrated an understanding of the instructions.   The patient was advised to call back or seek an in-person evaluation if the symptoms worsen or if the condition fails to improve as anticipated.  I provided 15  minutes of non-face-to-face time during this encounter.       History of Present Illness: Patient is a 70 years old currently widowed African-American female referred by primary care physician for management of depression  Lost grandson prior last visit, handling grief, feels lonely, kids visit her Feels tired apparently waking up late and sleeping late  Taking wellbutrin at night  Modifying factors; daughter aggravating factors; multiple medical conditions, widow, back pain. lonliness recent loss of grandkid  duration 50 plu syears      Past Psychiatric History:  depression  Previous Psychotropic Medications: Yes   Substance Abuse History in the last 12 months:  No.  Consequences of Substance Abuse: NA  Past Medical History:  Past Medical History:  Diagnosis Date   Lumbar spinal stenosis 12/24/2015   Primary osteoarthritis of left knee 12/24/2015   Disc with x-rays of the back and knees return to patient    Past Surgical History:  Procedure Laterality Date   breast cyst removal     BREAST REDUCTION SURGERY     KNEE ARTHROSCOPY     SPINE SURGERY      Family Psychiatric History: mom anxiety  Family History:  Family History  Problem Relation Age of Onset   Heart disease Maternal Grandmother    Stomach cancer Maternal Aunt     Social History:   Social History   Socioeconomic History   Marital status: Widowed    Spouse name: Not on file   Number of children: Not on file   Years of education: Not on file   Highest education level: Not on file  Occupational History   Occupation: retired  Tobacco Use   Smoking status: Never   Smokeless tobacco: Never  Vaping Use   Vaping Use: Never used  Substance and Sexual Activity   Alcohol use: No   Drug use: No   Sexual activity: Never  Other Topics Concern   Not on file  Social History Narrative   Not on file   Social Determinants of Health   Financial Resource Strain: Not on file  Food Insecurity: Not on file  Transportation Needs: Not  on file  Physical Activity: Not on file  Stress: Not on file  Social Connections: Not on file     Allergies:   Allergies  Allergen Reactions   Pravastatin Other (See Comments)    Elevated CK   Lexapro  [Escitalopram Oxalate]    Rosuvastatin Calcium    Doxycycline Nausea Only   Lisinopril Cough   Olanzapine Swelling   Simvastatin Other (See Comments)    Per pt it causes muscle pain   Voltaren [Diclofenac Sodium] Other (See Comments)   Zoloft [Sertraline Hcl]     Metabolic Disorder Labs: No results found for: HGBA1C, MPG No  results found for: PROLACTIN No results found for: CHOL, TRIG, HDL, CHOLHDL, VLDL, LDLCALC No results found for: TSH  Therapeutic Level Labs: No results found for: LITHIUM No results found for: CBMZ No results found for: VALPROATE  Current Medications: Current Outpatient Medications  Medication Sig Dispense Refill   amLODipine (NORVASC) 5 MG tablet TAKE 1 TABLET BY MOUTH EVERY DAY     buPROPion (WELLBUTRIN XL) 300 MG 24 hr tablet Take 1 tablet (300 mg total) by mouth in the morning and at bedtime. Delete the 150mg  refills.  change to 300mg  qam 30 tablet 0   Cholecalciferol (VITAMIN D) 2000 units CAPS Take by mouth.     Cyanocobalamin (B-12) 2000 MCG TABS Take by mouth.     hydrochlorothiazide (HYDRODIURIL) 12.5 MG tablet Take 12.5 mg by mouth daily.     lamoTRIgine (LAMICTAL) 100 MG tablet Take 1.5 tablets (150 mg total) by mouth daily. 30 tablet 0   metFORMIN (GLUCOPHAGE-XR) 500 MG 24 hr tablet TAKE 1 TABLET (500 MG TOTAL) BY MOUTH DAILY WITH BREAKFAST     potassium chloride (K-DUR) 10 MEQ tablet Take by mouth.     venlafaxine XR (EFFEXOR-XR) 75 MG 24 hr capsule TAKE 3 CAPSULES BY MOUTH EVERY DAY     No current facility-administered medications for this visit.      Psychiatric Specialty Exam: Review of Systems  Psychiatric/Behavioral:  Positive for sleep disturbance. Negative for agitation.    There were no vitals taken for this visit.There is no height or weight on file to calculate BMI.  General Appearance:   Eye Contact:  Speech:  Slow  Volume:  Decreased  Mood: Subdued  Affect:  Congruent  Thought Process:  Goal Directed  Orientation:  Full (Time, Place, and Person)  Thought Content:  Rumination  Suicidal Thoughts:  No  Homicidal Thoughts:  No  Memory:  Immediate;   Fair Recent;   Fair  Judgement:  Fair  Insight:  Shallow  Psychomotor Activity:  Decreased  Concentration:  Concentration: Fair and Attention Span: Fair  Recall:  of Knowledge:Fair   Language: Fair  Akathisia:  No  Handed:  Right  AIMS (if indicated):  not done  Assets:  Desire for Improvement  ADL's:  Intact  Cognition: WNL  Sleep:   poor to variable   Screenings: PHQ2-9    Flowsheet Row Counselor from 07/23/2020 in [facility] [place] Video Visit from 04/08/2020 in [facility] [place]  PHQ-2 Total Score 5 1  PHQ-9 Total Score 15 --      Flowsheet Row Video Visit from 08/08/2020 in [facility] [place] Counselor from 07/23/2020 in [facility] AT [place] Video Visit from 07/01/2020 in [facility] AT [place]  C-SSRS RISK CATEGORY No Risk No Risk No Risk  Assessment and Plan: as follows  MDD moderate recurrent; subdued, change wellbutrin to morning take all 300mg  in the morning, avoid waking up late so can sleep early Add activities during the day. Sees Mary for therapy but need to make frequent visits Continue lamcital will change from 100mg  1.5 tab to 150mg  one tab. No rash , work on , gets from pcp Insomnia: reviewed sleep hygiene, avoid waking up late or taking naps. Take wellbutrin in the morning    Fu in office 42m or earlier If needed QIW:[id number], MD 6/30/202210:46 AM

## 2020-09-04 ENCOUNTER — Ambulatory Visit (HOSPITAL_COMMUNITY): Payer: Medicare HMO | Admitting: Licensed Clinical Social Worker

## 2020-09-11 ENCOUNTER — Ambulatory Visit (INDEPENDENT_AMBULATORY_CARE_PROVIDER_SITE_OTHER): Payer: Medicare HMO | Admitting: Licensed Clinical Social Worker

## 2020-09-11 DIAGNOSIS — F331 Major depressive disorder, recurrent, moderate: Secondary | ICD-10-CM | POA: Diagnosis not present

## 2020-09-11 DIAGNOSIS — F4321 Adjustment disorder with depressed mood: Secondary | ICD-10-CM

## 2020-09-11 DIAGNOSIS — F411 Generalized anxiety disorder: Secondary | ICD-10-CM | POA: Diagnosis not present

## 2020-09-11 NOTE — Progress Notes (Signed)
Virtual Visit via Telephone Note  I connected with Karina Brown on 09/11/20 at  1:00 PM EDT by telephone and verified that I am speaking with the correct person using two identifiers.  Location: Patient: home Provider: home office   I discussed the limitations, risks, security and privacy concerns of performing an evaluation and management service by telephone and the availability of in person appointments. I also discussed with the patient that there may be a patient responsible charge related to this service. The patient expressed understanding and agreed to proceed.  I discussed the assessment and treatment plan with the patient. The patient was provided an opportunity to ask questions and all were answered. The patient agreed with the plan and demonstrated an understanding of the instructions.   The patient was advised to call back or seek an in-person evaluation if the symptoms worsen or if the condition fails to improve as anticipated.  I provided 54 minutes of non-face-to-face time during this encounter.  THERAPIST PROGRESS NOTE  Session Time: 1:00 PM to 1:54 PM  Participation Level: Active  Behavioral Response: CasualAlertDepressed  Type of Therapy: Individual Therapy  Treatment Goals addressed:  Anxiety, depression, grief, coping Interventions: Solution Focused, Strength-based, Supportive, and Other: grief  Summary: Karina Brown is a 70 y.o. female who presents with hanging in there gets a little rough sometimes. Losing great grandson still bothers her. Pray about it and talk about him with family. Some of it is funny and laugh about some of the memories but the thing that hurts the most was that he passed so fast couldn't say good-bye to him or hug him, say bye or say anything. Therapist explored who she specifically talks to and she relates she talks to her daughter, talked to granddaughter great grand son's mother. Try to not say too much to her, call her and pick up from how  she is talking whether to say something about him or not or go ahead and talk to her about what wanted to talk about. Not going to school might be hard on them. Redmond Baseman and patient were close, He had spent that night Sunday picked him up and he woke up 3 or 4 and said was going to die. Patient's response was to say don't say that and go back to sleep. Now she relates she should have said why do you said that? Woke that morning he woke up and not feeling good, watching television. Usually when not feeling good tell granddaughter to take him to doctor. Blame herself for not saying anything. At the same time family realize that he could have been at [place] and could not have saved him. Patient things she could have done this or this and cried because she couldn't have helped him. Hurt that nothing could do to help him, all could do is pray. Three grandchildren have had dreams about him. When they see him in the dream he looks tired. Wondered if asthma took a turn on him and maybe wanted to and ready to go. Doesn't know. Asthma so many attacks. Hospitalized a couple of times and guess is wearing him out. Mom gave him a breathing treatment when got home and was getting him another and heard him say I can't breath. Getting mucus out of mask and passed out, then died. Don't know talked about it and prayed about it. [place] it was God's will if not God would have left him here. God had a purpose for him. Look at that way but  hard. Talks to him. Have a picture of both of them another picture of him. Tell him love him and kiss his picture. When at [facility] not supposed to be here and be there with [place]. Not going to change that so many people praying if Lord's will not going to change his mind nothing can do that. Save him if his will. Not always the case that when we pray that our wish happens, one feels let down and angry with God. She relates she thinks about the people in [place] are grieving too, you are not the only one so  comforting not the only one. Wanted to do something to remember him. Scholarship to somebody to keep memory alive want people to know about that asthma. More than happening than we think. Granddaughter went on television to let people know more about people it. Thought about printing out handouts to tell people about asthma and action plan for it. What makes her angry have treatments for asthma why didn't they have, they probably did but did not know about it. Mad wished she had done more. Realizes she has had that type of thinking before wishing had done that, do something differently with husband, relates type of thinking of "hindsight is 20/20 thinking." She shares that when her husband died that she talked and talked about it therapist said this is what is recommended by the expert is the key coping skill to help with grief and helpful that she knows this.  Therapist reviewed symptoms, facilitated expression of thoughts and feelings and work with patient on grief utilizing grief interventions.  Noted patient's insight that we will help her with coping of talking about it therapist related to grief experts say this is key process and working through grief.  Discussed as well finding connection finding presents helps with grief as well noted things patient has like having a piture of them, talking to him.  Spirituality is very helpful.  Discussed redirecting energy can be helpful to honor them, discussed how working through grief can transform your grief.  Noted helpfulness for patient of finding connection not being the only one and referring to kids who were killed in [place] is helpful and noted patient's awareness on a larger scale of things that helps her with her own coping.  Work with patient on guilt feelings noted this is a very human reaction to grief and working through it by coming to realize our humanity and how we do not have control over life events and outcome to like go plus easier to look back in  things we could have done after the event, we do not have that same facility when you are in the middle of the event to know the outcome.  Therapist provided active listening, open questions, supportive interventions.  Suicidal/Homicidal: No  Plan: Return again in 1 week.2.  Work on grief, anxiety, depression look at psychology tolls grief handout  Diagnosis: Axis I:  major depressive disorder, recurrent, moderate, generalized anxiety disorder, grief    Axis II: No diagnosis    Coolidge Breeze, LCSW 09/11/2020

## 2020-09-18 ENCOUNTER — Ambulatory Visit (HOSPITAL_COMMUNITY): Payer: Medicare HMO | Admitting: Licensed Clinical Social Worker

## 2020-09-27 ENCOUNTER — Other Ambulatory Visit (HOSPITAL_COMMUNITY): Payer: Self-pay | Admitting: Psychiatry

## 2020-10-10 ENCOUNTER — Ambulatory Visit (HOSPITAL_COMMUNITY): Payer: Medicare HMO | Admitting: Psychiatry

## 2020-10-14 ENCOUNTER — Other Ambulatory Visit (HOSPITAL_COMMUNITY): Payer: Self-pay | Admitting: Psychiatry

## 2020-10-28 ENCOUNTER — Ambulatory Visit (HOSPITAL_COMMUNITY): Payer: Medicare HMO | Admitting: Licensed Clinical Social Worker

## 2020-10-28 NOTE — Progress Notes (Signed)
Patient is a no show for session.

## 2020-10-29 ENCOUNTER — Ambulatory Visit (INDEPENDENT_AMBULATORY_CARE_PROVIDER_SITE_OTHER): Payer: Medicare HMO | Admitting: Licensed Clinical Social Worker

## 2020-10-29 ENCOUNTER — Encounter (HOSPITAL_COMMUNITY): Payer: Self-pay

## 2020-10-29 DIAGNOSIS — F4321 Adjustment disorder with depressed mood: Secondary | ICD-10-CM

## 2020-10-29 DIAGNOSIS — F411 Generalized anxiety disorder: Secondary | ICD-10-CM

## 2020-10-29 DIAGNOSIS — F331 Major depressive disorder, recurrent, moderate: Secondary | ICD-10-CM | POA: Diagnosis not present

## 2020-10-29 NOTE — Progress Notes (Signed)
Virtual Visit via Telephone Note  I connected with Karina Brown on 10/29/20 at 11:00 AM EDT by telephone and verified that I am speaking with the correct person using two identifiers.  Location: Patient: home Provider: office   I discussed the limitations, risks, security and privacy concerns of performing an evaluation and management service by telephone and the availability of in person appointments. I also discussed with the patient that there may be a patient responsible charge related to this service. The patient expressed understanding and agreed to proceed.  I discussed the assessment and treatment plan with the patient. The patient was provided an opportunity to ask questions and all were answered. The patient agreed with the plan and demonstrated an understanding of the instructions.   The patient was advised to call back or seek an in-person evaluation if the symptoms worsen or if the condition fails to improve as anticipated.  I provided 53 minutes of non-face-to-face time during this encounter.   THERAPIST PROGRESS NOTE  Session Time: 11:04 AM to 11:57 AM  Participation Level: Active  Behavioral Response: CasualAlertDepressed  Type of Therapy: Individual Therapy  Treatment Goals addressed:  Decreased depression, processed feelings related to grief learn coping skills for depression and grief Interventions: Solution Focused, Strength-based, Supportive, and Other: grief, coping  Summary: Karina Brown is a 70 y.o. female who presents with hanging in there. Hard right now praying a lot can feel the lord lessening the pain. Talking helps what really gets her show pictures and videos that gets her sad again.  Explored strategies that have help patient process grief from the past. Lost both husbands helped to talk keeping him fresh on her mind talking and talking about it helped to accept it and move on.  Therapist encourage patient to talk about her great grandson loves to talk about  him wonderful child wish he was still there. Don't know of all the children they know he had to be the one had to leave and they are still there. Wonder if he is alright if he is seeing any other family. At same time have to believe looking after them and alright. Talked about signs of them present. Patient said red birds. Talked more about him. He was a very happy child. He loved to talk, things he talked about he would Google it, made sure that he knew what talking about. He was smart taught himself how to swim played basketball and football. Patient was close with him she would see him during the week, every other weekend he would spend a night They would watch TV, he loved to cook watch the cooking championship and some of the cartoons together. Tease each other and laugh. When she would pick up ask him what he want to eat and bring it to the house. Pray watch over his mom to get through, and she said he gave her strength to get through. So filled with grief don't feel has time to feel presence. Think of something he had done funny and make her laugh think about things like that. He had done so much things had done those eleven years more than many people live who were 50 or 60 years.  In terms of stages identifies bargaining, wonders why, feeling let him down should have told mom to take him to doctor wasn't feeling good usually say something so feel let him down. Wished had checked on him when left. Wished could have change that day. Don't know why? Has to trust  the lord reason at this particular time. Reviewed session pray keeps her going, put things on his grave, order a lot of things to put over grave, put stuff on there look pretty not forgotten about him. Couldn't go every day cry and cry. Describes a hole there nothing fill the whole that he did. He was wonderful person. After being around him never forget him because he would always give you some wisdom.  Therapist encouraged her to incorporate some of  those qualities into her own life as positive things she gained and can share with others  Therapist reviewed symptoms, facilitated expression of thoughts and feelings utilize processing of feelings to help work through grief, also coping for depression.  Utilize patient strengths of spirituality as a coping strategy.  Provided "some grief that might be helpful, look "grief day by day" discussed things such as finding them in the joyous moments of her life, grief is only loving it provides a lifelong connection with the loved one.  Talked about the 5 stages being tolls to understand how her feeling.  Noted understandings of grief including he have to feel it to heal it, working with patient's own insight about grief that it lessens, opens up the healing seat possibilities of more positive experiences.  Discussed if grief is not going to go away the metaphor of Growing around her grief.  As you are experiences accumulate the outer circle grows bigger around the grief and this happens your grief remains but no longer dominates becomes more bearable.  This way life grows around her grief and you continue to carry her grief with you.  Noted ways they become part of [place] will time can increase the hardship it also increases her capacity for joy because of never increasing weights we find him part of [place].  Noted helpfulness for patient is going to cemetery making sure grandson is not forgotten, decorating grave to honor and let him know that.  In session also had patient shared about her grandson some of his qualities and remembering these positive things will help with her grief.  Therapist provided active listening, open questions, supportive interventions.  Therapist reviewed treatment plan and patient gave consent to complete virtually Suicidal/Homicidal: No  Plan: Return again in 5 weeks. 2.Work on depression and grief, introduce more metaphors of grief, introduce coping skills for grief  Diagnosis: Axis I: major  depressive disorder, recurrent, moderate, generalized anxiety disorder, grief    Axis II: No diagnosis    Coolidge Breeze, LCSW 10/29/2020

## 2020-11-05 ENCOUNTER — Ambulatory Visit (HOSPITAL_COMMUNITY): Payer: Medicare HMO | Admitting: Psychiatry

## 2020-11-05 ENCOUNTER — Other Ambulatory Visit (HOSPITAL_COMMUNITY): Payer: Self-pay

## 2020-11-05 ENCOUNTER — Other Ambulatory Visit (HOSPITAL_COMMUNITY): Payer: Self-pay | Admitting: Psychiatry

## 2020-11-05 MED ORDER — BUPROPION HCL ER (XL) 300 MG PO TB24: ORAL_TABLET | ORAL | 0 refills | Status: DC

## 2020-11-07 ENCOUNTER — Ambulatory Visit (HOSPITAL_COMMUNITY): Payer: Medicare HMO | Admitting: Psychiatry

## 2020-11-21 ENCOUNTER — Ambulatory Visit (HOSPITAL_COMMUNITY): Payer: Medicare HMO | Admitting: Psychiatry

## 2020-12-03 ENCOUNTER — Ambulatory Visit (HOSPITAL_COMMUNITY): Payer: Medicare HMO | Admitting: Licensed Clinical Social Worker

## 2020-12-03 DIAGNOSIS — F411 Generalized anxiety disorder: Secondary | ICD-10-CM

## 2020-12-03 DIAGNOSIS — F4321 Adjustment disorder with depressed mood: Secondary | ICD-10-CM

## 2020-12-03 DIAGNOSIS — F331 Major depressive disorder, recurrent, moderate: Secondary | ICD-10-CM

## 2020-12-03 NOTE — Progress Notes (Signed)
  Summary: Karina Brown is a 70 y.o. female who presents with therapist reviewed things said last session that are helpful as we are working on her goals including losing her husband and just talking and talking about it is helpful, continuing to buy things for the grave site.  Along with this addressing patient's history of depression.  Patient relates that she has moments cry and moments doing better.  As we continue to talk she related she is at the vets her dog's had bleeding since yesterday, hopes is not going to cost a lot and attributed to possibly treats.  Therapist also agreed helping not a big problem as a pet lever understanding wanting to make sure pets are okay.  Discontinued session since patient is not in a private place and scheduled another session  Suicidal/Homicidal: No  Plan: Return again in 3 weeks.2.  Reviewed some of insights from past session including incorporate into her own life things she gained and can share with others, that the people he lost become part of [place] increased hardship but also increased the capacity for joy ever-increasing ways we find them in part of [place].  Continue to work through grief by talking about her loss and any ambivalent feelings related to loss.  Review stages of grief from notebook.  Through guilt identifying we think were responsible and we have to deal with overwhelming unfortunate events but in reality it may be a way to cope.  Look at grief handout for things like metaphors and grief, strategies that will help with coping.  Diagnosis: Axis I: major depressive disorder, recurrent, moderate, generalized anxiety disorder, grief    Axis II: No diagnosis    Coolidge Breeze, LCSW 12/03/2020

## 2020-12-24 ENCOUNTER — Ambulatory Visit (INDEPENDENT_AMBULATORY_CARE_PROVIDER_SITE_OTHER): Payer: Medicare HMO | Admitting: Licensed Clinical Social Worker

## 2020-12-24 DIAGNOSIS — F4321 Adjustment disorder with depressed mood: Secondary | ICD-10-CM

## 2020-12-24 DIAGNOSIS — F411 Generalized anxiety disorder: Secondary | ICD-10-CM

## 2020-12-24 DIAGNOSIS — F331 Major depressive disorder, recurrent, moderate: Secondary | ICD-10-CM | POA: Diagnosis not present

## 2020-12-24 NOTE — Progress Notes (Addendum)
Virtual Visit via Telephone Note  I connected with Karina Brown on 12/27/20 at  1:00 PM EST by telephone and verified that I am speaking with the correct person using two identifiers.  Location: Patient: home Provider: home office   I discussed the limitations, risks, security and privacy concerns of performing an evaluation and management service by telephone and the availability of in person appointments. I also discussed with the patient that there may be a patient responsible charge related to this service. The patient expressed understanding and agreed to proceed.   I discussed the assessment and treatment plan with the patient. The patient was provided an opportunity to ask questions and all were answered. The patient agreed with the plan and demonstrated an understanding of the instructions.   The patient was advised to call back or seek an in-person evaluation if the symptoms worsen or if the condition fails to improve as anticipated.  I provided 45 minutes of non-face-to-face time during this encounter.   Coolidge Breeze, LCSW    THERAPIST PROGRESS NOTE  Session Time: 1:00 PM to 1:45 PM  Participation Level: Active  Behavioral Response: CasualAlertDepressed  Type of Therapy: Individual Therapy  Treatment Goals addressed:  Decreased depression, processed feelings related to grief learn coping skills for depression and grief Interventions: Solution Focused, Strength-based, Supportive, Reframing, and Other: Grief  Summary: Karina Brown is a 70 y.o. female who presents with still holding on and still crying. Miss him so much. Balloon release and that was nice, flowers put around grave solar flowers lit up at night. [place] for his birthday and lot of people came. Also set off chinese lanterns. A happy occasion. Didn't see anyone crying therapist noted it helps when it is a celebration of life.  Open patient with that insight to encourage her doing more of that. Therapist shared her own  way of grieving was developing a scrapbook and she says they have a a lot of photos and videos and when go on Facebook see them. Sometimes doesn't want to see all these memories. Sometimes doesn't want to receive it.  Therapist noted part of process of grieving this time as we do not want to open ourselves to her because it is too hurtful. Looked at pictures sometimes cry and takes her to place if could have done something to change anything. Haven't accepted the loss. Can't let it go. Want him here. Trying and praying hopefully gets better.  Patient says because of 4 rolls of family of helping them hard also because  Redmond Baseman passed nothing could do place angry with herself nothing can do to help. She says to herself all these children why it has to be him? Talks to God and says you know it would hurt [place] why would you hurt [place]. Same time want to hold on and trust God not blame him and forgive him for what happened. Trying to get to get to accepting this and god not wanting to hurt her family. Wants to be able to trust God and move on. Do all can not able to accept it right now. Will do something at Christmas. It was his age that hurts her so badly, he was so young celebrated his 51th birthday at the [place]. Hard to go through.  Patient related but helpful today was talking about it and hearing that there are different stages go through. Go through them and that helps you to acceptance.   Therapist reviewed symptoms, facilitated expression of thoughts and feelings and  noted how patient celebration is a helpful ritual for grief celebrating life ways to keep him present and remembered through these different types of ceremonies and rituals.  Therapist noted can help with feelings.  Discussed concepts such as incorporate into her life things she gained and can share with others from her relationship with grandson things such as him liking to learn, educating people on asthma.  Therapist shared her own experiences away  she has incorporated them into her life including at holidays and note this is a good thing for patient to have some type of way to remember him.  Validated patient on the hardship of it especially since he was young.  Reviewed worksheets on stages of grief, Emphasizing the concept of morning is a process of adapting to loss through the completion of 4 tasks, and another model talks about 5 test keep in mind that adapting does not mean forgetting it means finding weighted chairs to memories of a lot of 1 we will continue to move forward, it means adjusting to her world without the deceased while holding a place for them in your heart.  Discussed except the reality of the last, processed the pain of grief and noted what we are doing in sessions that she has to feel it to heal it before she can get to acceptance.  And just to her walk without the disease so grief means navigating these changes and adjusting to the world without the loved 1.  Test for find a way to remember the disease well moving forward in life.  Also reviewed [employer] model.  Therapist assessed this is helpful to talk about stages as patient has insight that she has to move to the stages to get to acceptance and that it is a process.  Therapist work with patient on guilt coming to understand her limitations and fixing things, no difficulty since she is always taking care of things in the family but adjusting this concept based on the reality of life. Suicidal/Homicidal: No  Plan: Return again in 4 weeks.2.  At psychological tools for grieving, look more at therapist aid information on grieving, look at meeting making in the grief process, look at strategies particularly for people who have lost him wants.  Diagnosis: Axis I: major depressive disorder, recurrent, moderate, generalized anxiety disorder, grief    Axis II: No diagnosis    Coolidge Breeze, LCSW 12/24/2020

## 2020-12-28 ENCOUNTER — Emergency Department
Admission: EM | Admit: 2020-12-28 | Discharge: 2020-12-28 | Disposition: A | Discharge status: Home / Self Care | Payer: Medicare HMO | Source: Home / Self Care

## 2020-12-28 ENCOUNTER — Other Ambulatory Visit: Payer: Self-pay

## 2020-12-28 DIAGNOSIS — H109 Unspecified conjunctivitis: Secondary | ICD-10-CM

## 2020-12-28 HISTORY — DX: Depression, unspecified: F32.A

## 2020-12-28 HISTORY — DX: Essential (primary) hypertension: I10

## 2020-12-28 MED ORDER — SULFACETAMIDE SODIUM 10 % OP SOLN: 2.0000 [drp] | Freq: Three times a day (TID) | OPHTHALMIC | 0 refills | Status: AC

## 2020-12-28 NOTE — Discharge Instructions (Addendum)
Advised patient to take medication as directed.  Advised patient if left eye pain worsens, and/or unresolved please follow-up with optometrist/ophthalmologist early next week.

## 2020-12-28 NOTE — ED Triage Notes (Signed)
Pt presents to Urgent Care with c/o L eye pain and pink discoloration x several days. Denies other s/s besides seasonal allergies.

## 2020-12-28 NOTE — ED Provider Notes (Signed)
[facility]    CSN: [id number] Arrival date & time: 12/28/20  0906      History   Chief Complaint Chief Complaint  Patient presents with   Eye Problem    HPI Brittini Brubeck is a 70 y.o. female.   HPI 70 year old female presents with left eye pain and pink discoloration for 3 to 4 days.  Patient denies any other symptoms or complaints today.  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   Osteopenia 10/22/2016   Scoliosis due to degenerative disease of spine in adult patient 10/22/2016   Statin intolerance 09/28/2016   Major depression 03/02/2016   Primary osteoarthritis of left knee 12/24/2015   Stress incontinence 10/15/2015   History of adenomatous polyp of colon 09/02/2015   Low back pain 07/15/2015   DDD (degenerative disc disease), lumbar 07/15/2015   Glenoid fracture of shoulder 03/27/2013   Osteoarthritis of acromioclavicular joint 03/27/2013   Rotator cuff tendonitis 03/27/2013   Essential hypertension 09/25/2010   GERD (gastroesophageal reflux disease) 09/25/2010   Mixed hyperlipidemia 09/25/2010   Vitamin D deficiency 09/25/2010   Urge incontinence 09/25/2010   Sleep apnea 03/27/2008    Past Surgical History:  Procedure Laterality Date   breast cyst removal     BREAST REDUCTION SURGERY     KNEE ARTHROSCOPY     SPINE SURGERY      OB History     Gravida  4   Para  4   Term  4   Preterm      AB      Living  4      SAB      IAB      Ectopic      Multiple      Live  Births               Home Medications    Prior to Admission medications   Medication Sig Start Date End Date Taking? Authorizing Provider  sulfacetamide (BLEPH-10) 10 % ophthalmic solution Place 2 drops into the left eye in the morning, at noon, and at bedtime for 5 days. 12/28/20 01/02/21 Yes Trevor Iha, FNP  amLODipine (NORVASC) 5 MG tablet TAKE 1 TABLET BY MOUTH EVERY DAY 07/01/15   [provider]  buPROPion (WELLBUTRIN XL) 300 MG 24 hr tablet TAKE ONE TABLET BY MOUTH EVERY MORNING AND TAKE ONE TABLET BY MOUTH EVERY NIGHT AT BEDTIME 11/05/20   Thresa Ross, MD  Cholecalciferol (VITAMIN D) 2000 units CAPS Take by mouth.    [provider]  Cyanocobalamin (B-12) 2000 MCG TABS Take by mouth.    [provider]  hydrochlorothiazide (HYDRODIURIL) 12.5 MG tablet Take 12.5 mg by mouth daily. 08/18/18   [provider]  lamoTRIgine (LAMICTAL) 100 MG tablet Take 1.5 tablets (150 mg total) by mouth daily. 08/08/20   Thresa Ross, MD  metFORMIN (GLUCOPHAGE-XR) 500 MG 24 hr tablet TAKE 1 TABLET (500 MG TOTAL) BY MOUTH DAILY WITH BREAKFAST 11/07/15   [provider]  potassium chloride (K-DUR) 10 MEQ tablet Take by mouth. 10/17/15   [provider]  venlafaxine XR (EFFEXOR-XR) 75 MG 24 hr capsule TAKE 3 CAPSULES BY MOUTH EVERY DAY 12/16/15   [provider]    Family History Family History  Problem Relation Age of Onset   Heart disease Father    Heart disease Maternal Grandmother    Stomach cancer Maternal Aunt     Social History Social History   Tobacco Use   Smoking status: Never   Smokeless tobacco: Never  Vaping Use   Vaping Use: Never used  Substance Use Topics   Alcohol use: No   Drug use: No     Allergies   Pravastatin, Lexapro  [escitalopram oxalate], Rosuvastatin calcium, Doxycycline, Lisinopril, Olanzapine, Simvastatin, Voltaren [diclofenac sodium], and Zoloft [sertraline hcl]   Review of Systems Review  of Systems  Eyes:  Positive for pain and redness.  All other systems reviewed and are negative.   Physical Exam Triage Vital Signs ED Triage Vitals  Enc Vitals Group     BP 12/28/20 0941 122/76     Pulse Rate 12/28/20 0941 100     Resp 12/28/20 0941 20     Temp 12/28/20 0941 98.4 F (36.9 C)     Temp Source 12/28/20 0941 Oral     SpO2 12/28/20 0941 95 %     Weight 12/28/20 0936 240 lb (108.9 kg)     Height 12/28/20 0936 5\' 6"  (1.676 m)     Head Circumference --      Peak Flow --      Pain Score 12/28/20 0936 6     Pain Loc --      Pain Edu? --      Excl. in GC? --    No data found.  Updated Vital Signs BP 122/76   Pulse 100   Temp 98.4 F (36.9 C) (Oral)   Resp 20   Ht 5\' 6"  (1.676 m)   Wt 240 lb (108.9 kg)   SpO2 95%   BMI 38.74 kg/m        Physical Exam Vitals and nursing note reviewed.  Constitutional:      General: She is not in acute distress.    Appearance: Normal appearance. She is obese. She is not ill-appearing.  HENT:     Head: Normocephalic and atraumatic.     Mouth/Throat:     Mouth: Mucous membranes are moist.     Pharynx: Oropharynx is clear.  Eyes:     Extraocular Movements: Extraocular movements intact.     Pupils: Pupils are equal, round, and reactive to light.     Comments: Left eye: +4 injection of conjunctiva, mucopurulent discharge noted at inner and outer canthus  Cardiovascular:     Rate and Rhythm: Normal rate and regular rhythm.     Pulses: Normal pulses.     Heart sounds: Normal heart sounds.  Pulmonary:     Effort: Pulmonary effort is normal.     Breath sounds: Normal breath sounds.  Musculoskeletal:        General: Normal range of motion.     Cervical back: Normal range of motion and neck supple.  Skin:    General: Skin is warm and dry.  Neurological:     General: No focal deficit present.     Mental Status: She is alert and oriented to person, place, and time.     UC Treatments / Results  Labs (all labs  ordered  are listed, but only abnormal results are displayed) Labs Reviewed - No data to display  EKG   Radiology No results found.  Procedures Procedures (including critical care time)  Medications Ordered in UC Medications - No data to display  Initial Impression / Assessment and Plan / UC Course  I have reviewed the triage vital signs and the nursing notes.  Pertinent labs & imaging results that were available during my care of the patient were reviewed by me and considered in my medical decision making (see chart for details).     MDM: 1.  Conjunctivitis of left eye-Rx'd Sulfacetamide ophthalmic solution. Advised patient to take medication as directed.  Advised patient if left eye pain worsens, and/or unresolved please follow-up with optometrist/ophthalmologist early next week.  Patient discharged home, hemodynamically stable. Final Clinical Impressions(s) / UC Diagnoses   Final diagnoses:  Conjunctivitis of left eye, unspecified conjunctivitis type     Discharge Instructions      Advised patient to take medication as directed.  Advised patient if left eye pain worsens, and/or unresolved please follow-up with optometrist/ophthalmologist early next week.     ED Prescriptions     Medication Sig Dispense Auth. Provider   sulfacetamide (BLEPH-10) 10 % ophthalmic solution Place 2 drops into the left eye in the morning, at noon, and at bedtime for 5 days. 5 mL Trevor Iha, FNP      PDMP not reviewed this encounter.   Trevor Iha, FNP 12/28/20 1028

## 2021-01-14 ENCOUNTER — Ambulatory Visit (HOSPITAL_COMMUNITY): Payer: Medicare HMO | Admitting: Psychiatry

## 2021-01-21 ENCOUNTER — Ambulatory Visit (INDEPENDENT_AMBULATORY_CARE_PROVIDER_SITE_OTHER): Payer: Medicare HMO | Admitting: Licensed Clinical Social Worker

## 2021-01-21 DIAGNOSIS — F411 Generalized anxiety disorder: Secondary | ICD-10-CM

## 2021-01-21 DIAGNOSIS — F4321 Adjustment disorder with depressed mood: Secondary | ICD-10-CM

## 2021-01-21 DIAGNOSIS — F331 Major depressive disorder, recurrent, moderate: Secondary | ICD-10-CM

## 2021-01-21 NOTE — Progress Notes (Signed)
Therapist contacted patient by phone for session and message said that patient was unavailable. Session is a no show

## 2021-02-08 ENCOUNTER — Other Ambulatory Visit (HOSPITAL_COMMUNITY): Payer: Self-pay | Admitting: Psychiatry

## 2021-04-16 ENCOUNTER — Other Ambulatory Visit (HOSPITAL_COMMUNITY): Payer: Self-pay | Admitting: Psychiatry

## 2021-06-13 ENCOUNTER — Other Ambulatory Visit (HOSPITAL_COMMUNITY): Payer: Self-pay | Admitting: Psychiatry

## 2021-07-04 ENCOUNTER — Other Ambulatory Visit (HOSPITAL_COMMUNITY): Payer: Self-pay | Admitting: Psychiatry

## 2021-09-19 ENCOUNTER — Other Ambulatory Visit (HOSPITAL_COMMUNITY): Payer: Self-pay | Admitting: Psychiatry

## 2021-09-25 ENCOUNTER — Other Ambulatory Visit (HOSPITAL_COMMUNITY): Payer: Self-pay | Admitting: Psychiatry

## 2023-12-28 ENCOUNTER — Ambulatory Visit: Admitting: Neurosurgery

## 2023-12-28 ENCOUNTER — Encounter: Payer: Self-pay | Admitting: Neurosurgery

## 2023-12-28 VITALS — BP 127/77 | HR 92 | Temp 98.7°F | Ht 65.5 in | Wt 228.2 lb

## 2023-12-28 DIAGNOSIS — R200 Anesthesia of skin: Secondary | ICD-10-CM | POA: Diagnosis not present

## 2023-12-28 DIAGNOSIS — R202 Paresthesia of skin: Secondary | ICD-10-CM | POA: Diagnosis not present

## 2023-12-28 NOTE — Progress Notes (Signed)
 Assessment : 73 year old lady with a history of a knee surgery in 2023 after which she developed left leg numbness.  This was persistent and over time did not improve.  More recently, she had lumbar spinal surgery done by Dr. Blair and has held her left leg numbness.  There was some suspicion raised as to whether the patient had peroneal nerve entrapment was referred to me.  Patient had a EMG and a nerve conduction study done which demonstrated no signal in the left sural nerve however there is no specific mention of a peroneal nerve entrapment.  Patient was accompanied by her daughter who works at [employer].  Plan : On exam today she has near complete motor function in her left leg as well as the peroneal nerve.  Surprisingly, with the amount of denervation that is seen is in the anterior tibial muscle, her extensor houses longus as well as foot elevation are 5 out of 5.  She has sensory loss over the lateral leg but to a lesser degree over the dorsum of her foot.  I shared with her that I would like to talk to Dr. Blair about his thoughts around this prior to recommending a peroneal nerve decompression.  I am not certain that that is what is at play here though compression of fibers of the L5 and the peroneal nerve could be contributing to her current condition in addition to a lumbar stenosis.  I will give her a call at the end of the week after I have spoken to him.   Social History   Socioeconomic History   Marital status: Widowed    Spouse name: Not on file   Number of children: Not on file   Years of education: Not on file   Highest education level: Not on file  Occupational History   Occupation: retired  Tobacco Use   Smoking status: Never   Smokeless tobacco: Never  Vaping Use   Vaping status: Never Used  Substance and Sexual Activity   Alcohol use: No   Drug use: No   Sexual activity: Not on file  Other Topics Concern   Not on file  Social History  Narrative   Not on file   Social Drivers of Health   Financial Resource Strain: Low Risk  (09/14/2023)   Received from [facility]   Overall Financial Resource Strain (CARDIA)    How hard is it for you to pay for the very basics like food, housing, medical care, and heating?: Not very hard  Food Insecurity: No Food Insecurity (09/14/2023)   Received from [facility]   Hunger Vital Sign    Within the past 12 months, you worried that your food would run out before you got the money to buy more.: Never true    Within the past 12 months, the food you bought just didn't last and you didn't have money to get more.: Never true  Transportation Needs: No Transportation Needs (09/14/2023)   Received from [facility] - Transportation    In the past 12 months, has lack of transportation kept you from medical appointments or from getting medications?: No    In the past 12 months, has lack of transportation kept you from meetings, work, or from getting things needed for daily living?: No  Physical Activity: Inactive (09/14/2023)   Received from [facility]   Exercise Vital Sign    On average, how many days per week do you engage in moderate  to strenuous exercise (like a brisk walk)?: 0 days    Minutes of Exercise per Session: Not on file  Stress: Stress Concern Present (09/14/2023)   Received from [facility] - Occupational Stress Questionnaire    Do you feel stress - tense, restless, nervous, or anxious, or unable to sleep at night because your mind is troubled all the time - these days?: To some extent  Social Connections: Moderately Integrated (09/14/2023)   Received from [facility]   Social Network    How would you rate your social network (family, work, friends)?: Adequate participation with social networks  Intimate Partner Violence: Not At Risk (09/14/2023)   Received from [facility]   HITS    Over the last 12 months how often did  your partner physically hurt you?: Never    Over the last 12 months how often did your partner insult you or talk down to you?: Never    Over the last 12 months how often did your partner threaten you with physical harm?: Never    Over the last 12 months how often did your partner scream or curse at you?: Never    Family History  Problem Relation Age of Onset   Heart disease Father    Heart disease Maternal Grandmother    Stomach cancer Maternal Aunt     Allergies  Allergen Reactions   Pravastatin Other (See Comments)    Elevated CK   Lexapro  [Escitalopram Oxalate]    Rosuvastatin Calcium    Doxycycline Nausea Only   Lisinopril Cough   Olanzapine Swelling   Simvastatin Other (See Comments)    Per pt it causes muscle pain   Voltaren [Diclofenac Sodium] Other (See Comments)   Zoloft [Sertraline Hcl]     Past Medical History:  Diagnosis Date   Depression    Hypertension    Lumbar spinal stenosis 12/24/2015   Primary osteoarthritis of left knee 12/24/2015   Disc with x-rays of the back and knees return to patient    Past Surgical History:  Procedure Laterality Date   breast cyst removal     BREAST REDUCTION SURGERY     KNEE ARTHROSCOPY     SPINE SURGERY       Physical Exam   Physical Exam HENT:     Head: Normocephalic.     Nose: Nose normal.  Eyes:     Pupils: Pupils are equal, round, and reactive to light.  Cardiovascular:     Rate and Rhythm: Normal rate.  Pulmonary:     Effort: Pulmonary effort is normal.  Abdominal:     General: Abdomen is flat.  Musculoskeletal:     Cervical back: Normal range of motion.  Neurological:     Mental Status: She is alert.     Cranial Nerves: Cranial nerves 2-12 are intact.     Sensory: Sensation is intact.     Motor: Motor function is intact.     Coordination: Coordination is intact.     No results found for this or any previous visit.

## 2023-12-31 ENCOUNTER — Ambulatory Visit: Admitting: Neurosurgery

## 2023-12-31 DIAGNOSIS — M79605 Pain in left leg: Secondary | ICD-10-CM | POA: Diagnosis not present

## 2023-12-31 DIAGNOSIS — R200 Anesthesia of skin: Secondary | ICD-10-CM | POA: Diagnosis not present

## 2023-12-31 NOTE — Progress Notes (Signed)
 Virtual Visit via Telephone Note  I connected with Karina Brown on 12/31/23 at  3:00 PM EST by telephone and verified that I am speaking with the correct person using two identifiers.  Location: Patient: Home Provider: Clinic   I discussed the limitations, risks, security and privacy concerns of performing an evaluation and management service by telephone and the availability of in person appointments. I also discussed with the patient that there may be a patient responsible charge related to this service. The patient expressed understanding and agreed to proceed.  73 year old lady with history of orthopedic surgery and later spinal surgery.  She has pain and numbness in her left leg and EMG suspected the patient to have an anterior tibial artery weakness.  I communicated with her surgeon, Dr. 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  I explained to her that I am willing to do a peroneal nerve exploration and decompression of both branches and explained the procedure as well as the risk of numbness and permanent weakness to her.  She would like to give this a try and I will get her scheduled for a left-sided peroneal nerve decompression.    I discussed the assessment and treatment plan with the patient. The patient was provided an opportunity to ask questions and all were answered. The patient agreed with the plan and demonstrated an understanding of the instructions.   The patient was advised to call back or seek an in-person evaluation if the symptoms worsen or if the condition fails to improve as anticipated.  I provided 10 minutes of non-face-to-face time during this encounter.   Roswell Ndiaye, MD

## 2024-02-14 ENCOUNTER — Other Ambulatory Visit: Payer: Self-pay

## 2024-02-14 DIAGNOSIS — R200 Anesthesia of skin: Secondary | ICD-10-CM

## 2024-04-03 ENCOUNTER — Ambulatory Visit (HOSPITAL_COMMUNITY): Admit: 2024-04-03 | Admitting: Neurosurgery

## 2024-04-18 ENCOUNTER — Encounter: Admitting: Neurosurgery
